# Patient Record
Sex: Female | Born: 1985
Health system: Southern US, Community
[De-identification: ages and names within clinical notes are randomized; demographics above are authoritative.]

## PROBLEM LIST (undated history)

## (undated) DIAGNOSIS — F952 Tourette's disorder: Secondary | ICD-10-CM

## (undated) DIAGNOSIS — E559 Vitamin D deficiency, unspecified: Secondary | ICD-10-CM

## (undated) DIAGNOSIS — E039 Hypothyroidism, unspecified: Secondary | ICD-10-CM

## (undated) DIAGNOSIS — E079 Disorder of thyroid, unspecified: Secondary | ICD-10-CM

## (undated) DIAGNOSIS — K219 Gastro-esophageal reflux disease without esophagitis: Secondary | ICD-10-CM

## (undated) DIAGNOSIS — G43909 Migraine, unspecified, not intractable, without status migrainosus: Secondary | ICD-10-CM

## (undated) DIAGNOSIS — E538 Deficiency of other specified B group vitamins: Secondary | ICD-10-CM

## (undated) DIAGNOSIS — I10 Essential (primary) hypertension: Secondary | ICD-10-CM

## (undated) HISTORY — DX: Deficiency of other specified B group vitamins: E53.8

## (undated) HISTORY — PX: CHOLECYSTECTOMY: SHX55

## (undated) HISTORY — DX: Gastro-esophageal reflux disease without esophagitis: K21.9

## (undated) HISTORY — DX: Vitamin D deficiency, unspecified: E55.9

## (undated) HISTORY — PX: TONSILLECTOMY: SUR1361

## (undated) HISTORY — DX: Tourette's disorder: F95.2

## (undated) HISTORY — PX: ABLATION: SHX5711

## (undated) HISTORY — DX: Migraine, unspecified, not intractable, without status migrainosus: G43.909

## (undated) HISTORY — PX: WISDOM TOOTH EXTRACTION: SHX21

## (undated) HISTORY — DX: Hypothyroidism, unspecified: E03.9

---
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
## 2003-02-05 ENCOUNTER — Other Ambulatory Visit: Admission: RE | Admit: 2003-02-05 | Discharge: 2003-02-05 | Payer: Self-pay | Admitting: Obstetrics and Gynecology

## 2003-06-16 ENCOUNTER — Other Ambulatory Visit: Admission: RE | Admit: 2003-06-16 | Discharge: 2003-06-16 | Payer: Self-pay | Admitting: Gynecology

## 2003-12-31 ENCOUNTER — Other Ambulatory Visit: Admission: RE | Admit: 2003-12-31 | Discharge: 2003-12-31 | Payer: Self-pay | Admitting: Obstetrics and Gynecology

## 2004-03-18 ENCOUNTER — Inpatient Hospital Stay (HOSPITAL_COMMUNITY): Admission: RE | Admit: 2004-03-18 | Discharge: 2004-03-22 | Payer: Self-pay | Admitting: Endocrinology

## 2004-03-18 ENCOUNTER — Encounter: Admission: RE | Admit: 2004-03-18 | Discharge: 2004-03-18 | Payer: Self-pay | Admitting: Family Medicine

## 2004-03-22 ENCOUNTER — Encounter (INDEPENDENT_AMBULATORY_CARE_PROVIDER_SITE_OTHER): Payer: Self-pay | Admitting: *Deleted

## 2004-07-27 ENCOUNTER — Ambulatory Visit (HOSPITAL_COMMUNITY): Admission: RE | Admit: 2004-07-27 | Discharge: 2004-07-27 | Payer: Self-pay | Admitting: Gynecology

## 2004-08-16 ENCOUNTER — Other Ambulatory Visit: Admission: RE | Admit: 2004-08-16 | Discharge: 2004-08-16 | Payer: Self-pay | Admitting: Gynecology

## 2004-12-09 ENCOUNTER — Inpatient Hospital Stay (HOSPITAL_COMMUNITY): Admission: AD | Admit: 2004-12-09 | Discharge: 2004-12-09 | Payer: Self-pay | Admitting: Gynecology

## 2004-12-20 ENCOUNTER — Inpatient Hospital Stay (HOSPITAL_COMMUNITY): Admission: AD | Admit: 2004-12-20 | Discharge: 2004-12-20 | Payer: Self-pay | Admitting: Gynecology

## 2004-12-21 ENCOUNTER — Inpatient Hospital Stay (HOSPITAL_COMMUNITY): Admission: AD | Admit: 2004-12-21 | Discharge: 2004-12-21 | Payer: Self-pay | Admitting: Gynecology

## 2004-12-27 ENCOUNTER — Observation Stay (HOSPITAL_COMMUNITY): Admission: AD | Admit: 2004-12-27 | Discharge: 2004-12-28 | Payer: Self-pay | Admitting: Gynecology

## 2005-06-13 ENCOUNTER — Ambulatory Visit: Payer: Self-pay | Admitting: Family Medicine

## 2005-06-28 ENCOUNTER — Ambulatory Visit: Payer: Self-pay | Admitting: Family Medicine

## 2005-08-30 ENCOUNTER — Ambulatory Visit: Payer: Self-pay | Admitting: Family Medicine

## 2005-12-26 ENCOUNTER — Ambulatory Visit: Payer: Self-pay | Admitting: Family Medicine

## 2006-01-26 ENCOUNTER — Encounter: Payer: Self-pay | Admitting: Family Medicine

## 2006-01-26 LAB — CONVERTED CEMR LAB

## 2006-01-31 ENCOUNTER — Other Ambulatory Visit: Admission: RE | Admit: 2006-01-31 | Discharge: 2006-01-31 | Payer: Self-pay | Admitting: Obstetrics and Gynecology

## 2006-08-30 ENCOUNTER — Ambulatory Visit: Payer: Self-pay | Admitting: Family Medicine

## 2006-11-13 ENCOUNTER — Ambulatory Visit: Payer: Self-pay | Admitting: Internal Medicine

## 2007-02-14 ENCOUNTER — Telehealth: Payer: Self-pay | Admitting: Family Medicine

## 2007-05-30 ENCOUNTER — Emergency Department (HOSPITAL_COMMUNITY): Admission: EM | Admit: 2007-05-30 | Discharge: 2007-05-30 | Payer: Self-pay | Admitting: Emergency Medicine

## 2007-08-05 ENCOUNTER — Emergency Department (HOSPITAL_COMMUNITY): Admission: EM | Admit: 2007-08-05 | Discharge: 2007-08-05 | Payer: Self-pay | Admitting: Emergency Medicine

## 2007-08-06 ENCOUNTER — Encounter: Payer: Self-pay | Admitting: Family Medicine

## 2007-08-06 ENCOUNTER — Ambulatory Visit: Payer: Self-pay | Admitting: Family Medicine

## 2007-08-06 DIAGNOSIS — F988 Other specified behavioral and emotional disorders with onset usually occurring in childhood and adolescence: Secondary | ICD-10-CM | POA: Insufficient documentation

## 2007-08-06 DIAGNOSIS — E039 Hypothyroidism, unspecified: Secondary | ICD-10-CM | POA: Insufficient documentation

## 2007-08-06 DIAGNOSIS — Z8719 Personal history of other diseases of the digestive system: Secondary | ICD-10-CM

## 2007-08-06 DIAGNOSIS — F411 Generalized anxiety disorder: Secondary | ICD-10-CM | POA: Insufficient documentation

## 2007-08-06 DIAGNOSIS — L509 Urticaria, unspecified: Secondary | ICD-10-CM | POA: Insufficient documentation

## 2007-08-06 DIAGNOSIS — F952 Tourette's disorder: Secondary | ICD-10-CM

## 2007-08-06 DIAGNOSIS — I10 Essential (primary) hypertension: Secondary | ICD-10-CM | POA: Insufficient documentation

## 2007-08-07 ENCOUNTER — Encounter: Payer: Self-pay | Admitting: Family Medicine

## 2007-08-08 ENCOUNTER — Encounter (INDEPENDENT_AMBULATORY_CARE_PROVIDER_SITE_OTHER): Payer: Self-pay | Admitting: *Deleted

## 2007-08-08 ENCOUNTER — Telehealth (INDEPENDENT_AMBULATORY_CARE_PROVIDER_SITE_OTHER): Payer: Self-pay | Admitting: *Deleted

## 2007-08-08 LAB — CONVERTED CEMR LAB
Basophils Absolute: 0 10*3/uL (ref 0.0–0.1)
Eosinophils Absolute: 0 10*3/uL (ref 0.0–0.6)
Eosinophils Relative: 0.2 % (ref 0.0–5.0)
Lymphocytes Relative: 25.2 % (ref 12.0–46.0)
MCHC: 34.1 g/dL (ref 30.0–36.0)
MCV: 89.6 fL (ref 78.0–100.0)
Monocytes Absolute: 0.6 10*3/uL (ref 0.2–0.7)
Monocytes Relative: 4.8 % (ref 3.0–11.0)
Neutro Abs: 8.2 10*3/uL — ABNORMAL HIGH (ref 1.4–7.7)
Platelets: 210 10*3/uL (ref 150–400)
RBC: 4.26 M/uL (ref 3.87–5.11)
RDW: 12.5 % (ref 11.5–14.6)

## 2007-08-26 ENCOUNTER — Ambulatory Visit: Payer: Self-pay | Admitting: Family Medicine

## 2007-09-23 ENCOUNTER — Telehealth (INDEPENDENT_AMBULATORY_CARE_PROVIDER_SITE_OTHER): Payer: Self-pay | Admitting: *Deleted

## 2007-09-25 ENCOUNTER — Telehealth: Payer: Self-pay | Admitting: Family Medicine

## 2007-09-27 ENCOUNTER — Ambulatory Visit: Payer: Self-pay | Admitting: Family Medicine

## 2007-11-22 ENCOUNTER — Ambulatory Visit: Payer: Self-pay | Admitting: Family Medicine

## 2007-11-25 ENCOUNTER — Encounter (INDEPENDENT_AMBULATORY_CARE_PROVIDER_SITE_OTHER): Payer: Self-pay | Admitting: *Deleted

## 2007-11-25 LAB — CONVERTED CEMR LAB
Albumin: 3.9 g/dL (ref 3.5–5.2)
Chloride: 107 meq/L (ref 96–112)
Cortisol, Plasma: 10 ug/dL
Free T4: 0.8 ng/dL (ref 0.6–1.6)
GFR calc Af Amer: 133 mL/min
Potassium: 4.1 meq/L (ref 3.5–5.1)
TSH: 1.05 microintl units/mL (ref 0.35–5.50)

## 2007-11-27 LAB — CONVERTED CEMR LAB
HCV Ab: NEGATIVE
Hep B C IgM: NEGATIVE
Hepatitis B Surface Ag: NEGATIVE

## 2008-02-12 ENCOUNTER — Ambulatory Visit: Payer: Self-pay | Admitting: Family Medicine

## 2008-02-12 ENCOUNTER — Telehealth: Payer: Self-pay | Admitting: Family Medicine

## 2008-02-13 LAB — CONVERTED CEMR LAB: Potassium: 4 meq/L (ref 3.5–5.1)

## 2008-04-13 ENCOUNTER — Encounter: Payer: Self-pay | Admitting: Family Medicine

## 2008-04-20 ENCOUNTER — Encounter (INDEPENDENT_AMBULATORY_CARE_PROVIDER_SITE_OTHER): Payer: Self-pay | Admitting: Otolaryngology

## 2008-04-20 ENCOUNTER — Ambulatory Visit (HOSPITAL_BASED_OUTPATIENT_CLINIC_OR_DEPARTMENT_OTHER): Admission: RE | Admit: 2008-04-20 | Discharge: 2008-04-20 | Payer: Self-pay | Admitting: Otolaryngology

## 2008-04-27 ENCOUNTER — Telehealth (INDEPENDENT_AMBULATORY_CARE_PROVIDER_SITE_OTHER): Payer: Self-pay | Admitting: *Deleted

## 2008-05-08 ENCOUNTER — Encounter: Payer: Self-pay | Admitting: Family Medicine

## 2008-06-16 ENCOUNTER — Telehealth: Payer: Self-pay | Admitting: Family Medicine

## 2008-06-16 ENCOUNTER — Ambulatory Visit: Payer: Self-pay | Admitting: Family Medicine

## 2008-06-17 ENCOUNTER — Telehealth: Payer: Self-pay | Admitting: Family Medicine

## 2008-06-18 ENCOUNTER — Telehealth: Payer: Self-pay | Admitting: Family Medicine

## 2008-06-19 ENCOUNTER — Telehealth: Payer: Self-pay | Admitting: Family Medicine

## 2008-06-19 ENCOUNTER — Ambulatory Visit: Payer: Self-pay | Admitting: Family Medicine

## 2008-06-20 ENCOUNTER — Telehealth: Payer: Self-pay | Admitting: Family Medicine

## 2008-07-03 ENCOUNTER — Ambulatory Visit: Payer: Self-pay | Admitting: Family Medicine

## 2008-07-08 ENCOUNTER — Telehealth (INDEPENDENT_AMBULATORY_CARE_PROVIDER_SITE_OTHER): Payer: Self-pay | Admitting: *Deleted

## 2008-08-20 ENCOUNTER — Ambulatory Visit: Payer: Self-pay | Admitting: Family Medicine

## 2008-08-24 LAB — CONVERTED CEMR LAB
Calcium: 9.2 mg/dL (ref 8.4–10.5)
Eosinophils Absolute: 0.1 10*3/uL (ref 0.0–0.7)
Eosinophils Relative: 1.7 % (ref 0.0–5.0)
Free T4: 0.8 ng/dL (ref 0.6–1.6)
GFR calc non Af Amer: 94 mL/min
HCT: 38.8 % (ref 36.0–46.0)
Hemoglobin: 13.3 g/dL (ref 12.0–15.0)
Lymphocytes Relative: 31.7 % (ref 12.0–46.0)
MCHC: 34.2 g/dL (ref 30.0–36.0)
Platelets: 252 10*3/uL (ref 150–400)
RBC: 4.47 M/uL (ref 3.87–5.11)
T4, Total: 6.7 ug/dL (ref 5.0–12.5)
Vitamin B-12: 435 pg/mL (ref 211–911)

## 2008-08-25 LAB — CONVERTED CEMR LAB: Vit D, 1,25-Dihydroxy: 20 — ABNORMAL LOW (ref 30–89)

## 2008-09-23 ENCOUNTER — Ambulatory Visit: Payer: Self-pay | Admitting: Family Medicine

## 2008-09-23 DIAGNOSIS — I8390 Asymptomatic varicose veins of unspecified lower extremity: Secondary | ICD-10-CM | POA: Insufficient documentation

## 2008-09-24 LAB — CONVERTED CEMR LAB
HDL: 36.9 mg/dL — ABNORMAL LOW (ref >39.0)
Total CHOL/HDL Ratio: 4.3

## 2008-10-22 ENCOUNTER — Ambulatory Visit: Payer: Self-pay | Admitting: Family Medicine

## 2008-10-22 DIAGNOSIS — E559 Vitamin D deficiency, unspecified: Secondary | ICD-10-CM | POA: Insufficient documentation

## 2008-10-26 LAB — CONVERTED CEMR LAB: Vit D, 25-Hydroxy: 50 ng/mL (ref 30–89)

## 2008-11-02 ENCOUNTER — Telehealth: Payer: Self-pay | Admitting: Family Medicine

## 2008-11-12 ENCOUNTER — Telehealth: Payer: Self-pay | Admitting: Family Medicine

## 2008-11-19 ENCOUNTER — Ambulatory Visit: Payer: Self-pay | Admitting: Family Medicine

## 2009-01-11 ENCOUNTER — Telehealth: Payer: Self-pay | Admitting: Family Medicine

## 2009-09-06 ENCOUNTER — Inpatient Hospital Stay (HOSPITAL_COMMUNITY): Admission: AD | Admit: 2009-09-06 | Discharge: 2009-09-06 | Payer: Self-pay | Admitting: Obstetrics and Gynecology

## 2009-09-13 ENCOUNTER — Inpatient Hospital Stay (HOSPITAL_COMMUNITY): Admission: AD | Admit: 2009-09-13 | Discharge: 2009-09-21 | Payer: Self-pay | Admitting: Obstetrics and Gynecology

## 2009-09-16 ENCOUNTER — Encounter (INDEPENDENT_AMBULATORY_CARE_PROVIDER_SITE_OTHER): Payer: Self-pay | Admitting: Obstetrics and Gynecology

## 2009-11-03 ENCOUNTER — Emergency Department (HOSPITAL_COMMUNITY): Admission: EM | Admit: 2009-11-03 | Discharge: 2009-11-04 | Payer: Self-pay | Admitting: Emergency Medicine

## 2009-12-21 ENCOUNTER — Encounter: Admission: RE | Admit: 2009-12-21 | Discharge: 2009-12-21 | Payer: Self-pay | Admitting: Family Medicine

## 2010-02-11 ENCOUNTER — Encounter: Admission: RE | Admit: 2010-02-11 | Discharge: 2010-02-11 | Payer: Self-pay | Admitting: *Deleted

## 2010-11-13 LAB — LACTATE DEHYDROGENASE
LDH: 134 U/L (ref 94–250)
LDH: 157 U/L (ref 94–250)
LDH: 166 U/L (ref 94–250)
LDH: 242 U/L (ref 94–250)
LDH: 280 U/L — ABNORMAL HIGH (ref 94–250)
LDH: 378 U/L — ABNORMAL HIGH (ref 94–250)
LDH: 391 U/L — ABNORMAL HIGH (ref 94–250)
LDH: 530 U/L — ABNORMAL HIGH (ref 94–250)

## 2010-11-13 LAB — COMPREHENSIVE METABOLIC PANEL
ALT: 123 U/L — ABNORMAL HIGH (ref 0–35)
ALT: 143 U/L — ABNORMAL HIGH (ref 0–35)
ALT: 179 U/L — ABNORMAL HIGH (ref 0–35)
ALT: 237 U/L — ABNORMAL HIGH (ref 0–35)
ALT: 238 U/L — ABNORMAL HIGH (ref 0–35)
ALT: 283 U/L — ABNORMAL HIGH (ref 0–35)
ALT: 40 U/L — ABNORMAL HIGH (ref 0–35)
ALT: 53 U/L — ABNORMAL HIGH (ref 0–35)
ALT: 57 U/L — ABNORMAL HIGH (ref 0–35)
AST: 122 U/L — ABNORMAL HIGH (ref 0–37)
AST: 218 U/L — ABNORMAL HIGH (ref 0–37)
AST: 226 U/L — ABNORMAL HIGH (ref 0–37)
AST: 30 U/L (ref 0–37)
AST: 53 U/L — ABNORMAL HIGH (ref 0–37)
AST: 57 U/L — ABNORMAL HIGH (ref 0–37)
AST: 60 U/L — ABNORMAL HIGH (ref 0–37)
Albumin: 2.3 g/dL — ABNORMAL LOW (ref 3.5–5.2)
Albumin: 2.5 g/dL — ABNORMAL LOW (ref 3.5–5.2)
Albumin: 2.5 g/dL — ABNORMAL LOW (ref 3.5–5.2)
Albumin: 2.6 g/dL — ABNORMAL LOW (ref 3.5–5.2)
Alkaline Phosphatase: 100 U/L (ref 39–117)
Alkaline Phosphatase: 106 U/L (ref 39–117)
Alkaline Phosphatase: 111 U/L (ref 39–117)
Alkaline Phosphatase: 116 U/L (ref 39–117)
Alkaline Phosphatase: 123 U/L — ABNORMAL HIGH (ref 39–117)
Alkaline Phosphatase: 81 U/L (ref 39–117)
Alkaline Phosphatase: 87 U/L (ref 39–117)
Alkaline Phosphatase: 94 U/L (ref 39–117)
Alkaline Phosphatase: 98 U/L (ref 39–117)
BUN: 10 mg/dL (ref 6–23)
BUN: 10 mg/dL (ref 6–23)
BUN: 11 mg/dL (ref 6–23)
BUN: 13 mg/dL (ref 6–23)
BUN: 8 mg/dL (ref 6–23)
CO2: 20 mEq/L (ref 19–32)
CO2: 21 mEq/L (ref 19–32)
CO2: 22 mEq/L (ref 19–32)
CO2: 23 mEq/L (ref 19–32)
CO2: 23 mEq/L (ref 19–32)
CO2: 23 mEq/L (ref 19–32)
CO2: 25 mEq/L (ref 19–32)
CO2: 25 mEq/L (ref 19–32)
CO2: 26 mEq/L (ref 19–32)
CO2: 27 mEq/L (ref 19–32)
CO2: 30 mEq/L (ref 19–32)
Calcium: 6.4 mg/dL — CL (ref 8.4–10.5)
Calcium: 6.9 mg/dL — ABNORMAL LOW (ref 8.4–10.5)
Calcium: 7.2 mg/dL — ABNORMAL LOW (ref 8.4–10.5)
Calcium: 7.4 mg/dL — ABNORMAL LOW (ref 8.4–10.5)
Calcium: 7.9 mg/dL — ABNORMAL LOW (ref 8.4–10.5)
Calcium: 7.9 mg/dL — ABNORMAL LOW (ref 8.4–10.5)
Calcium: 8.4 mg/dL (ref 8.4–10.5)
Calcium: 8.7 mg/dL (ref 8.4–10.5)
Calcium: 8.7 mg/dL (ref 8.4–10.5)
Chloride: 104 mEq/L (ref 96–112)
Chloride: 102 mEq/L (ref 96–112)
Chloride: 103 mEq/L (ref 96–112)
Chloride: 104 mEq/L (ref 96–112)
Chloride: 104 mEq/L (ref 96–112)
Chloride: 107 mEq/L (ref 96–112)
Chloride: 99 mEq/L (ref 96–112)
Creatinine, Ser: 0.47 mg/dL (ref 0.4–1.2)
Creatinine, Ser: 0.6 mg/dL (ref 0.4–1.2)
Creatinine, Ser: 0.66 mg/dL (ref 0.4–1.2)
Creatinine, Ser: 0.68 mg/dL (ref 0.4–1.2)
GFR calc Af Amer: >60 mL/min (ref >60)
GFR calc Af Amer: >60 mL/min (ref >60)
GFR calc Af Amer: >60 mL/min (ref >60)
GFR calc Af Amer: >60 mL/min (ref >60)
GFR calc Af Amer: >60 mL/min (ref >60)
GFR calc Af Amer: >60 mL/min (ref >60)
GFR calc non Af Amer: >60 mL/min (ref >60)
GFR calc non Af Amer: >60 mL/min (ref >60)
GFR calc non Af Amer: >60 mL/min (ref >60)
GFR calc non Af Amer: >60 mL/min (ref >60)
GFR calc non Af Amer: >60 mL/min (ref >60)
GFR calc non Af Amer: >60 mL/min (ref >60)
GFR calc non Af Amer: >60 mL/min (ref >60)
GFR calc non Af Amer: >60 mL/min (ref >60)
GFR calc non Af Amer: >60 mL/min (ref >60)
GFR calc non Af Amer: >60 mL/min (ref >60)
Glucose, Bld: 101 mg/dL — ABNORMAL HIGH (ref 70–99)
Glucose, Bld: 133 mg/dL — ABNORMAL HIGH (ref 70–99)
Glucose, Bld: 73 mg/dL (ref 70–99)
Glucose, Bld: 73 mg/dL (ref 70–99)
Glucose, Bld: 75 mg/dL (ref 70–99)
Glucose, Bld: 80 mg/dL (ref 70–99)
Glucose, Bld: 80 mg/dL (ref 70–99)
Glucose, Bld: 81 mg/dL (ref 70–99)
Glucose, Bld: 81 mg/dL (ref 70–99)
Glucose, Bld: 95 mg/dL (ref 70–99)
Potassium: 4.1 mEq/L (ref 3.5–5.1)
Potassium: 4.2 mEq/L (ref 3.5–5.1)
Potassium: 4.2 mEq/L (ref 3.5–5.1)
Potassium: 4.3 mEq/L (ref 3.5–5.1)
Potassium: 4.3 mEq/L (ref 3.5–5.1)
Potassium: 4.4 mEq/L (ref 3.5–5.1)
Potassium: 4.5 mEq/L (ref 3.5–5.1)
Potassium: 4.6 mEq/L (ref 3.5–5.1)
Potassium: 4.7 mEq/L (ref 3.5–5.1)
Sodium: 134 mEq/L — ABNORMAL LOW (ref 135–145)
Sodium: 132 mEq/L — ABNORMAL LOW (ref 135–145)
Sodium: 134 mEq/L — ABNORMAL LOW (ref 135–145)
Sodium: 135 mEq/L (ref 135–145)
Sodium: 135 mEq/L (ref 135–145)
Sodium: 135 mEq/L (ref 135–145)
Sodium: 135 mEq/L (ref 135–145)
Sodium: 135 mEq/L (ref 135–145)
Sodium: 136 mEq/L (ref 135–145)
Sodium: 136 mEq/L (ref 135–145)
Sodium: 137 mEq/L (ref 135–145)
Total Bilirubin: 0.2 mg/dL — ABNORMAL LOW (ref 0.3–1.2)
Total Bilirubin: 0.1 mg/dL — ABNORMAL LOW (ref 0.3–1.2)
Total Bilirubin: 0.3 mg/dL (ref 0.3–1.2)
Total Bilirubin: 0.3 mg/dL (ref 0.3–1.2)
Total Bilirubin: 0.5 mg/dL (ref 0.3–1.2)
Total Bilirubin: 0.6 mg/dL (ref 0.3–1.2)
Total Bilirubin: 0.7 mg/dL (ref 0.3–1.2)
Total Bilirubin: 0.9 mg/dL (ref 0.3–1.2)
Total Protein: 4.8 g/dL — ABNORMAL LOW (ref 6.0–8.3)
Total Protein: 5.4 g/dL — ABNORMAL LOW (ref 6.0–8.3)
Total Protein: 5.5 g/dL — ABNORMAL LOW (ref 6.0–8.3)
Total Protein: 5.8 g/dL — ABNORMAL LOW (ref 6.0–8.3)
Total Protein: 5.8 g/dL — ABNORMAL LOW (ref 6.0–8.3)

## 2010-11-13 LAB — CBC
HCT: 35.3 % — ABNORMAL LOW (ref 36.0–46.0)
HCT: 28.6 % — ABNORMAL LOW (ref 36.0–46.0)
HCT: 31.5 % — ABNORMAL LOW (ref 36.0–46.0)
HCT: 32.5 % — ABNORMAL LOW (ref 36.0–46.0)
HCT: 32.8 % — ABNORMAL LOW (ref 36.0–46.0)
HCT: 34.1 % — ABNORMAL LOW (ref 36.0–46.0)
HCT: 34.3 % — ABNORMAL LOW (ref 36.0–46.0)
HCT: 34.9 % — ABNORMAL LOW (ref 36.0–46.0)
Hemoglobin: 12.2 g/dL (ref 12.0–15.0)
Hemoglobin: 10.8 g/dL — ABNORMAL LOW (ref 12.0–15.0)
Hemoglobin: 10.8 g/dL — ABNORMAL LOW (ref 12.0–15.0)
Hemoglobin: 10.9 g/dL — ABNORMAL LOW (ref 12.0–15.0)
Hemoglobin: 10.9 g/dL — ABNORMAL LOW (ref 12.0–15.0)
Hemoglobin: 11 g/dL — ABNORMAL LOW (ref 12.0–15.0)
Hemoglobin: 11.6 g/dL — ABNORMAL LOW (ref 12.0–15.0)
Hemoglobin: 12.7 g/dL (ref 12.0–15.0)
MCHC: 34.5 g/dL (ref 30.0–36.0)
MCHC: 33.7 g/dL (ref 30.0–36.0)
MCHC: 34 g/dL (ref 30.0–36.0)
MCHC: 34 g/dL (ref 30.0–36.0)
MCHC: 34 g/dL (ref 30.0–36.0)
MCHC: 34.1 g/dL (ref 30.0–36.0)
MCHC: 34.2 g/dL (ref 30.0–36.0)
MCHC: 34.2 g/dL (ref 30.0–36.0)
MCHC: 34.3 g/dL (ref 30.0–36.0)
MCHC: 34.3 g/dL (ref 30.0–36.0)
MCHC: 34.5 g/dL (ref 30.0–36.0)
MCHC: 34.6 g/dL (ref 30.0–36.0)
MCV: 93.7 fL (ref 78.0–100.0)
MCV: 93.9 fL (ref 78.0–100.0)
MCV: 94 fL (ref 78.0–100.0)
MCV: 94.1 fL (ref 78.0–100.0)
MCV: 94.1 fL (ref 78.0–100.0)
MCV: 94.2 fL (ref 78.0–100.0)
MCV: 94.5 fL (ref 78.0–100.0)
MCV: 94.6 fL (ref 78.0–100.0)
MCV: 94.8 fL (ref 78.0–100.0)
MCV: 94.8 fL (ref 78.0–100.0)
MCV: 94.9 fL (ref 78.0–100.0)
Platelets: 136 10*3/uL — ABNORMAL LOW (ref 150–400)
Platelets: 106 10*3/uL — ABNORMAL LOW (ref 150–400)
Platelets: 125 10*3/uL — ABNORMAL LOW (ref 150–400)
Platelets: 45 10*3/uL — ABNORMAL LOW (ref 150–400)
Platelets: 48 10*3/uL — ABNORMAL LOW (ref 150–400)
Platelets: 53 10*3/uL — ABNORMAL LOW (ref 150–400)
Platelets: 95 10*3/uL — ABNORMAL LOW (ref 150–400)
RBC: 3.24 MIL/uL — ABNORMAL LOW (ref 3.87–5.11)
RBC: 3.31 MIL/uL — ABNORMAL LOW (ref 3.87–5.11)
RBC: 3.34 MIL/uL — ABNORMAL LOW (ref 3.87–5.11)
RBC: 3.35 MIL/uL — ABNORMAL LOW (ref 3.87–5.11)
RBC: 3.38 MIL/uL — ABNORMAL LOW (ref 3.87–5.11)
RBC: 3.39 MIL/uL — ABNORMAL LOW (ref 3.87–5.11)
RBC: 3.46 MIL/uL — ABNORMAL LOW (ref 3.87–5.11)
RBC: 3.62 MIL/uL — ABNORMAL LOW (ref 3.87–5.11)
RBC: 3.63 MIL/uL — ABNORMAL LOW (ref 3.87–5.11)
RBC: 3.66 MIL/uL — ABNORMAL LOW (ref 3.87–5.11)
RBC: 3.73 MIL/uL — ABNORMAL LOW (ref 3.87–5.11)
RBC: 3.91 MIL/uL (ref 3.87–5.11)
RBC: 3.97 MIL/uL (ref 3.87–5.11)
RDW: 12.5 % (ref 11.5–15.5)
RDW: 12.7 % (ref 11.5–15.5)
RDW: 13.4 % (ref 11.5–15.5)
RDW: 13.6 % (ref 11.5–15.5)
RDW: 13.8 % (ref 11.5–15.5)
WBC: 10.6 10*3/uL — ABNORMAL HIGH (ref 4.0–10.5)
WBC: 12.3 10*3/uL — ABNORMAL HIGH (ref 4.0–10.5)
WBC: 13.4 10*3/uL — ABNORMAL HIGH (ref 4.0–10.5)
WBC: 13.8 10*3/uL — ABNORMAL HIGH (ref 4.0–10.5)
WBC: 14.8 10*3/uL — ABNORMAL HIGH (ref 4.0–10.5)
WBC: 15 10*3/uL — ABNORMAL HIGH (ref 4.0–10.5)
WBC: 16.3 10*3/uL — ABNORMAL HIGH (ref 4.0–10.5)
WBC: 16.4 10*3/uL — ABNORMAL HIGH (ref 4.0–10.5)
WBC: 18 10*3/uL — ABNORMAL HIGH (ref 4.0–10.5)
WBC: 18.5 10*3/uL — ABNORMAL HIGH (ref 4.0–10.5)

## 2010-11-13 LAB — URINALYSIS, ROUTINE W REFLEX MICROSCOPIC
Bilirubin Urine: NEGATIVE
Glucose, UA: NEGATIVE mg/dL
Glucose, UA: NEGATIVE mg/dL
Ketones, ur: NEGATIVE mg/dL
Ketones, ur: NEGATIVE mg/dL
Leukocytes, UA: NEGATIVE
Leukocytes, UA: NEGATIVE
Protein, ur: 30 mg/dL — AB
Protein, ur: 100 mg/dL — AB
Specific Gravity, Urine: 1.02 (ref 1.005–1.030)
Urobilinogen, UA: 0.2 mg/dL (ref 0.0–1.0)
Urobilinogen, UA: 1 mg/dL (ref 0.0–1.0)
pH: 5.5 (ref 5.0–8.0)
pH: 8 (ref 5.0–8.0)

## 2010-11-13 LAB — CROSSMATCH

## 2010-11-13 LAB — PROTIME-INR
INR: 0.96 (ref 0.00–1.49)
Prothrombin Time: 12.7 seconds (ref 11.6–15.2)

## 2010-11-13 LAB — URINE CULTURE

## 2010-11-13 LAB — URIC ACID
Uric Acid, Serum: 4.6 mg/dL (ref 2.4–7.0)
Uric Acid, Serum: 4.8 mg/dL (ref 2.4–7.0)
Uric Acid, Serum: 5.2 mg/dL (ref 2.4–7.0)
Uric Acid, Serum: 5.3 mg/dL (ref 2.4–7.0)

## 2010-11-13 LAB — STREP B DNA PROBE: Strep Group B Ag: NEGATIVE

## 2010-11-13 LAB — URINE MICROSCOPIC-ADD ON

## 2010-11-13 LAB — CREATININE CLEARANCE, URINE, 24 HOUR
Collection Interval-CRCL: 24 hours
Creatinine Clearance: 158 mL/min — ABNORMAL HIGH (ref 75–115)

## 2010-11-13 LAB — PROTEIN, URINE, 24 HOUR
Collection Interval-UPROT: 24 hours
Protein, 24H Urine: 675 mg/d — ABNORMAL HIGH (ref 50–100)
Protein, Urine: 54 mg/dL

## 2010-11-13 LAB — MAGNESIUM: Magnesium: 5.2 mg/dL — ABNORMAL HIGH (ref 1.5–2.5)

## 2010-11-13 LAB — GAMMA GT: GGT: 32 U/L (ref 7–51)

## 2010-11-18 LAB — DIFFERENTIAL
Basophils Absolute: 0 10*3/uL (ref 0.0–0.1)
Basophils Relative: 0 % (ref 0–1)
Neutro Abs: 5.4 10*3/uL (ref 1.7–7.7)
Neutrophils Relative %: 61 % (ref 43–77)

## 2010-11-18 LAB — POCT I-STAT, CHEM 8
Calcium, Ion: 1.05 mmol/L — ABNORMAL LOW (ref 1.12–1.32)
Chloride: 110 mEq/L (ref 96–112)
HCT: 36 % (ref 36.0–46.0)
Potassium: 3.4 mEq/L — ABNORMAL LOW (ref 3.5–5.1)
Sodium: 136 mEq/L (ref 135–145)

## 2010-11-18 LAB — D-DIMER, QUANTITATIVE: D-Dimer, Quant: 0.76 ug/mL-FEU — ABNORMAL HIGH (ref 0.00–0.48)

## 2010-11-18 LAB — CBC
MCHC: 34.9 g/dL (ref 30.0–36.0)
Platelets: 206 10*3/uL (ref 150–400)
RDW: 12.4 % (ref 11.5–15.5)

## 2011-01-10 NOTE — Op Note (Signed)
NAMEMYEESHA, SHANE             ACCOUNT NO.:  [id number]   MEDICAL RECORD NO.:  [id number]          PATIENT TYPE:  AMB   LOCATION:  DSC                          FACILITY:  [facility]   PHYSICIAN:  Jefry H. Pollyann Kennedy, MD     DATE OF BIRTH:  August 12, 1986   DATE OF PROCEDURE:  04/20/2008  DATE OF DISCHARGE:                               OPERATIVE REPORT   PREOPERATIVE DIAGNOSIS:  Chronic tonsillitis.   POSTOPERATIVE DIAGNOSIS:  Chronic tonsillitis.   PROCEDURE:  Tonsillectomy.   SURGEON:  Jefry H. Pollyann Kennedy, MD   General endotracheal anesthesia was used.   No complications.   ESTIMATED BLOOD LOSS:  Minimal.   FINDINGS:  Enlarged cryptic tonsils with deep cryptic space with large  amount of cryptic debris, worse on the right side.   HISTORY:  A 25 year old with a history of chronic and recurring  tonsillopharyngitis.  Risks, benefits, alternatives, and complications  of the procedure were explained to the patient, and she seems to  understand and agreed to surgery.   PROCEDURE:  The patient was taken to the operating room, placed on the  operating room table in supine position.  Following induction of general  endotracheal anesthesia, the table was turned.  The patient was draped  in the standard fashion.  A Crowe-Davis mouthgag was inserted into the  oral cavity, used to retract the tongue and mandible, and attached to  Mayo stand.  Inspection of the palate revealed no evidence of a  submucous cleft or shortening of the soft palate.  Red rubber catheter  was inserted into the right side of the nose, withdrawn through the  mouth, and used to retract the soft palate and uvula.  The nasopharynx  was palpated.  There is no appreciable adenoid tissue present.  The  tonsillectomy was performed using electrocautery dissection, carefully  dissecting the avascular plane between the capsule and constrictor  muscles.  A large amount of cryptic debris exuded from the tonsils  during the  manipulation.  Tonsils were sent together for pathological  evaluation.  A suction cautery device  was used for completion of hemostasis.  The pharynx was irrigated with  saline and suctioned.  An orogastric tube was used to aspirate the  contents of the stomach.  The patient was then awakened, extubated, and  transferred to recovery in stable condition.      Jefry H. Pollyann Kennedy, MD  Electronically Signed     JHR/MEDQ  D:  04/20/2008  T:  04/20/2008  Job:  [id number]

## 2011-01-13 NOTE — Consult Note (Signed)
NAME:  Rebecca [name], Rebecca [name]                         ACCOUNT NO.:  [id number]   MEDICAL RECORD NO.:  [id number]                   PATIENT TYPE:  INP   LOCATION:  5504                                 FACILITY:  [facility]   PHYSICIAN:  Iva Boop, M.D. [facility]           DATE OF BIRTH:  07/13/1986   DATE OF CONSULTATION:  DATE OF DISCHARGE:                                   CONSULTATION   REFERRING PHYSICIAN:  Dr. Corinda Gubler.   PRIMARY CARE PHYSICIAN:  Dr. Roxy Manns.   REASON FOR CONSULTATION:  Colitis.   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  She usually moves her bowels  about every two weeks without much discomfort or pain.  She had some  congestion and took pseudoephedrine for about one week and developed high  blood pressure and did not feel quite right and thought that it aggravated  her Tourette's syndrome.  Subsequent to that, she developed crampy abdominal  pain, nausea, vomiting, fevers to 102.9 with watery diarrhea and bloody  bowel movements with that.  This was worse on the day of admission, March 18, 2004.  In the office, she had a hemoglobin of 15.3, hematocrit 45, white  count 12.3, platelet count 198.  Her glucose was 49, her potassium was 2.7.  A CT scan of the abdomen, which I have self-viewed, as well, on the  computer, showed diffuse colitis with an edematous colon, mainly in the  right colon.  She has been improving here in the hospital.  She has less  pain, but still has diarrhea.  She has had a set stool Hemoccults that are  positive, most of which were negative, however.  Stool for Clostridium  difficile was negative.   MEDICATIONS:  At home:  1. Synthroid 75 mcg q. day.  2. Topamax 25 mg b.i.d.  3. Aviane 28 q. day.  4. Clonidine 0.1 mg b.i.d.  5. ORAP 1 mg b.i.d.   ALLERGIES:  SULFA, causes hives.   PAST MEDICAL HISTORY:  1. Tourette's syndrome.  2. Attention deficit disorder.  3.  Hypothyroidism.   SOCIAL HISTORY:  No alcohol or tobacco.  She is in nursing school at [employer]  planning to transfer to [employer]   FAMILY HISTORY:  Grandfather died at age 79 of colon cancer.  Other  grandfather had myocardial infarction.  Father has Barrett's esophagus and  has had coronary artery bypass grafting as well.   REVIEW OF SYSTEMS:  No dysuria, no respiratory symptoms at this point.  Her  congestion is better.  Weight dropped 40 pounds with initiation of Synthroid  over one year ago.  All other review of systems appears negative at this  time.  She has been improving, as mentioned above.  She does have some  insomnia, other than those things mentioned above.   PHYSICAL EXAMINATION:  GENERAL:  Pleasant, young, white woman in no acute  distress.  VITAL SIGNS:  Temperature 97.7; pulse 76; blood pressure 111/69;  respirations 18.  EYES:  Anicteric.  MOUTH:  Free of lesions.  NECK:  Supple.  No mass or thyromegaly.  CHEST:  Clear.  HEART:  S1, S2, no murmurs, rubs or gallops.  ABDOMEN:  Soft, nontender at this time, bowel sounds are present.  There is  no organomegaly or mass.  EXTREMITIES:  No edema.  SKIN:  Tanned; no acute rash; warm and dry.  NEUROLOGIC:  She is alert and oriented times three.  Cranial nerves II-XII  are grossly intact.  LYMPH NODES:  No neck or supraclavicular nodes.   LABORATORY DATA:  From most recent labs, BMET looks normal today.  Hemoglobin 12.9, white count 5.8, hematocrit 37, platelets 233.  Clostridium  difficile was negative.   X-rays as described above.   ASSESSMENT:  1. Acute colitis after using pseudoephedrine for one week, which caused     hypertension.  I think she probably has ischemic colitis related to that.     Infection is not excluded.  2. Chronic constipation, which sounds like slow transient constipation.  3. Tourette's syndrome.  4. Hypothyroidism.   RECOMMENDATIONS AND PLAN:  I think that we should proceed with colonoscopy   to confirm this diagnosis.  I am fairly confident that she has ischemic  colitis.  She was given some empiric antibiotics.  I do not think that it is  infectious.  Fortunately, she is improving.   Once colonoscopy is complete and she is improved, she will need a long term  laxative regimen either with an osmotic laxative or something like Zelnorm.  Given that she does not have much in the way of pain, the osmotic laxative  may be the way to go.  She will need outpatient follow-up for this.   I have explained the risks, benefits and indications of colonoscopy and she  understands these include, but not limited to, bleeding, infection,  medication reaction, perforations, or possible need for surgery and she  agrees to proceed.   I appreciate the opportunity to care for this patient.                                               Iva Boop, M.D. [facility]    CEG/MEDQ  D:  03/21/2004  T:  03/21/2004  Job:  [id number]   cc:   Marne A. Milinda Antis, M.D. [facility]

## 2011-01-13 NOTE — Discharge Summary (Signed)
NAMECRISTA, Rebecca [name]               ACCOUNT NO.:  [id number]   MEDICAL RECORD NO.:  [id number]          PATIENT TYPE:  INP   LOCATION:  5526                         FACILITY:  [facility]   PHYSICIAN:  Rene Paci, M.D. LHCDATE OF BIRTH:  [birth year]   DATE OF ADMISSION:  03/18/2004  DATE OF DISCHARGE:  03/22/2004                                 DISCHARGE SUMMARY   DISCHARGE DIAGNOSIS:  Probable ischemic colitis.   BRIEF HISTORY:  Ms. Rebecca [name] is a 25 year old white female who was in her usual  state of health until March 15, 2004 when she developed bilateral lower  quadrant abdominal cramping, nausea, and chills.  No vomiting and no  diarrhea.  She was noted to have a temperature of 102.   PAST MEDICAL HISTORY:  1.  Tourette's syndrome.  2.  ADD.  3.  Hypothyroidism.   HOSPITAL COURSE:  Problem 1:  GASTROINTESTINAL:  The patient presented with  probable colitis by CT, etiology of Crohn's versus ulcerative colitis were  not clear.  Stool for C. difficile is negative.  Stools were heme positive.  This prompted a GI evaluation.  The patient underwent a colonoscopy and on  March 21, 2004 colonoscopy from the cecum to the terminal ileus was normal.  There were a couple of patches of erythema and it was suspected that the  patient had resolving ischemic colitis.   DISCHARGE MEDICATIONS:  1.  Levothyroxine 75 mcg q.d.  2.  Topamax 25 mg b.i.d.  3.  Clonidine 1 mg b.i.d.  4.  Orap 1 mg b.i.d.  5.  Aviane 28 mg 1 q.d.  6.  Levsin q.4h. p.r.n.  7.  Imodium p.r.n.   FOLLOW UP:  Follow up with Dr. Idamae Schuller A. Tower in one to two weeks and Dr.  Iva Boop as needed.       LC/MEDQ  D:  05/17/2004  T:  05/17/2004  Job:  [id number]   cc:   Marne A. Milinda Antis, M.D. [facility]

## 2011-01-13 NOTE — Assessment & Plan Note (Signed)
[facility]                                 ON-CALL NOTE   NAME:[name], Rebecca MUSTON                    MRN:          [id number]  DATE:07/07/2008                            DOB:          19-May-1986    PATIENT OF:  Marne A. Tower, MD   Called [place] at [phone] at 9:13 p.m. on July 07, 2008, complaining of  severe headache and nausea.  I recommended her to go to the emergency  room to be evaluated.     Lelon Perla, DO  Electronically Signed    Shawnie Dapper  DD: 07/07/2008  DT: 07/08/2008  Job #: [id number]   cc:   Marne A. Milinda Antis, MD

## 2011-05-31 ENCOUNTER — Emergency Department (HOSPITAL_COMMUNITY)
Admission: EM | Admit: 2011-05-31 | Discharge: 2011-05-31 | Discharge status: Against Medical Advice | Payer: BC Managed Care – PPO | Attending: Emergency Medicine | Admitting: Emergency Medicine

## 2011-05-31 DIAGNOSIS — IMO0001 Reserved for inherently not codable concepts without codable children: Secondary | ICD-10-CM | POA: Insufficient documentation

## 2011-05-31 DIAGNOSIS — R5381 Other malaise: Secondary | ICD-10-CM | POA: Insufficient documentation

## 2011-05-31 DIAGNOSIS — R5383 Other fatigue: Secondary | ICD-10-CM | POA: Insufficient documentation

## 2011-05-31 DIAGNOSIS — R51 Headache: Secondary | ICD-10-CM | POA: Insufficient documentation

## 2011-05-31 LAB — POCT I-STAT, CHEM 8
Calcium, Ion: 1.26 mmol/L (ref 1.12–1.32)
Chloride: 107 mEq/L (ref 96–112)
HCT: 43 % (ref 36.0–46.0)
Potassium: 3.9 mEq/L (ref 3.5–5.1)

## 2013-04-01 ENCOUNTER — Encounter (HOSPITAL_COMMUNITY): Payer: Self-pay | Admitting: *Deleted

## 2013-04-01 DIAGNOSIS — E079 Disorder of thyroid, unspecified: Secondary | ICD-10-CM | POA: Insufficient documentation

## 2013-04-01 DIAGNOSIS — R1031 Right lower quadrant pain: Secondary | ICD-10-CM | POA: Insufficient documentation

## 2013-04-01 DIAGNOSIS — Z79899 Other long term (current) drug therapy: Secondary | ICD-10-CM | POA: Insufficient documentation

## 2013-04-01 DIAGNOSIS — I10 Essential (primary) hypertension: Secondary | ICD-10-CM | POA: Insufficient documentation

## 2013-04-01 DIAGNOSIS — Z3202 Encounter for pregnancy test, result negative: Secondary | ICD-10-CM | POA: Insufficient documentation

## 2013-04-01 LAB — URINALYSIS, ROUTINE W REFLEX MICROSCOPIC
Ketones, ur: NEGATIVE mg/dL
Leukocytes, UA: NEGATIVE
Nitrite: NEGATIVE
pH: 6 (ref 5.0–8.0)

## 2013-04-01 LAB — POCT PREGNANCY, URINE: Preg Test, Ur: NEGATIVE

## 2013-04-01 NOTE — ED Notes (Signed)
Pt in c/o RLQ abd pain since Sunday with chills, sent here from urgent care due to pain location and elevated WBC to r/o appendicitis, pt with printed copy of lab work

## 2013-04-02 ENCOUNTER — Encounter (HOSPITAL_COMMUNITY): Payer: Self-pay | Admitting: Radiology

## 2013-04-02 ENCOUNTER — Emergency Department (HOSPITAL_COMMUNITY): Payer: BC Managed Care – PPO

## 2013-04-02 ENCOUNTER — Emergency Department (HOSPITAL_COMMUNITY)
Admission: EM | Admit: 2013-04-02 | Discharge: 2013-04-02 | Disposition: A | Discharge status: Home / Self Care | Payer: BC Managed Care – PPO | Attending: Emergency Medicine | Admitting: Emergency Medicine

## 2013-04-02 DIAGNOSIS — R109 Unspecified abdominal pain: Secondary | ICD-10-CM

## 2013-04-02 HISTORY — DX: Disorder of thyroid, unspecified: E07.9

## 2013-04-02 HISTORY — DX: Essential (primary) hypertension: I10

## 2013-04-02 LAB — CBC WITH DIFFERENTIAL/PLATELET
Basophils Absolute: 0 10*3/uL (ref 0.0–0.1)
Eosinophils Relative: 1 % (ref 0–5)
Lymphocytes Relative: 25 % (ref 12–46)
MCV: 86.2 fL (ref 78.0–100.0)
Neutrophils Relative %: 68 % (ref 43–77)
Platelets: 227 10*3/uL (ref 150–400)
RBC: 4.34 MIL/uL (ref 3.87–5.11)
RDW: 13.7 % (ref 11.5–15.5)
WBC: 11.1 10*3/uL — ABNORMAL HIGH (ref 4.0–10.5)

## 2013-04-02 LAB — COMPREHENSIVE METABOLIC PANEL
ALT: 13 U/L (ref 0–35)
AST: 21 U/L (ref 0–37)
Alkaline Phosphatase: 67 U/L (ref 39–117)
CO2: 22 mEq/L (ref 19–32)
Calcium: 9.5 mg/dL (ref 8.4–10.5)
GFR calc non Af Amer: >90 mL/min (ref >90)
Potassium: 4 mEq/L (ref 3.5–5.1)
Sodium: 140 mEq/L (ref 135–145)
Total Protein: 7.6 g/dL (ref 6.0–8.3)

## 2013-04-02 MED ORDER — IOHEXOL 300 MG/ML  SOLN
100.0000 mL | Freq: Once | INTRAMUSCULAR | Status: AC | PRN
  Administered 2013-04-02: 100 mL via INTRAVENOUS

## 2013-04-02 NOTE — ED Provider Notes (Signed)
CSN: [id number]     Arrival date & time 04/01/13  2130 History     First MD Initiated Contact with Patient 04/02/13 0302     Chief Complaint  Patient presents with  . Abdominal Pain   (Consider location/radiation/quality/duration/timing/severity/associated sxs/prior Treatment) HPI This 27 year old female is 3 days of intermittent right lower quadrant pain without associated symptoms, 2 days ago she had intermittent sudden sharp pains, yesterday she had constant pain all day, today she has had intermittent pain to the right lower quadrant with 2 sudden sharp crampy episodes but no pain now although she still tender at the right lower quadrant so was sent to the ED by the urgent care to rule out appendicitis, she has no fever chest pain shortness of breath nausea vomiting diarrhea dysuria vaginal bleeding vaginal discharge  Past Medical History  Diagnosis Date  . Hypertension   . Thyroid disease    History reviewed. No pertinent past surgical history. History reviewed. No pertinent family history. History  Substance Use Topics  . Smoking status: Not on file  . Smokeless tobacco: Not on file  . Alcohol Use: Not on file   OB History   Grav Para Term Preterm Abortions TAB SAB Ect Mult Living                 Review of Systems 10 Systems reviewed and are negative for acute change except as noted in the HPI. Allergies  Diphenhydramine hcl; Promethazine hcl; Pseudoephedrine; Sulfonamide derivatives; and Zithromax  Home Medications   Current Outpatient Rx  Name  Route  Sig  Dispense  Refill  . clonazePAM (KLONOPIN) 1 MG tablet   Oral   Take 1 mg by mouth 3 (three) times daily as needed for anxiety.         . docusate sodium (COLACE) 100 MG capsule   Oral   Take 200 mg by mouth daily as needed for constipation.         [name] ibuprofen (ADVIL,MOTRIN) 200 MG tablet   Oral   Take 400 mg by mouth daily as needed for headache.         . levothyroxine (SYNTHROID, LEVOTHROID) 75  MCG tablet   Oral   Take 75 mcg by mouth daily before breakfast.         . lisinopril (PRINIVIL,ZESTRIL) 20 MG tablet   Oral   Take 20 mg by mouth daily.         . Multiple Vitamins-Minerals (MULTIVITAMIN PO)   Oral   Take 1 tablet by mouth daily.         . pimozide (ORAP) 2 MG tablet   Oral   Take 2 mg by mouth daily.         . sertraline (ZOLOFT) 50 MG tablet   Oral   Take 50 mg by mouth daily.         [name] topiramate (TOPAMAX) 25 MG tablet   Oral   Take 25 mg by mouth daily.          BP 113/73  Pulse 72  Temp(Src) 97.9 F (36.6 C) (Oral)  Resp 16  SpO2 100%  LMP 03/19/2013 Physical Exam  Nursing note and vitals reviewed. Constitutional:  Awake, alert, nontoxic appearance.  HENT:  Head: Atraumatic.  Eyes: Right eye exhibits no discharge. Left eye exhibits no discharge.  Neck: Neck supple.  Cardiovascular: Normal rate and regular rhythm.   No murmur heard. Pulmonary/Chest: Effort normal and breath sounds normal. No respiratory distress. She  has no wheezes. She has no rales. She exhibits no tenderness.  Abdominal: Soft. Bowel sounds are normal. She exhibits no distension and no mass. There is tenderness. There is no rebound and no guarding.  mild right lower quadrant tenderness with bimanual examination nontender (chaparone present) no blood or discharge on bimanual examination glove  Musculoskeletal: She exhibits no edema and no tenderness.  Baseline ROM, no obvious new focal weakness.  Neurological: She is alert.  Mental status and motor strength appears baseline for patient and situation.  Skin: No rash noted.  Psychiatric: She has a normal mood and affect.    ED Course   Procedures (including critical care time) Patient / Family / Caregiver informed of clinical course, understand medical decision-making process, and agree with plan. Labs Reviewed  URINALYSIS, ROUTINE W REFLEX MICROSCOPIC - Abnormal; Notable for the following:    APPearance  CLOUDY (*)    All other components within normal limits  CBC WITH DIFFERENTIAL - Abnormal; Notable for the following:    WBC 11.1 (*)    All other components within normal limits  COMPREHENSIVE METABOLIC PANEL - Abnormal; Notable for the following:    Total Bilirubin 0.2 (*)    All other components within normal limits  LIPASE, BLOOD  POCT PREGNANCY, URINE   Ct Abdomen Pelvis W Contrast  04/02/2013   *RADIOLOGY REPORT*  Clinical Data: Diffuse abdominal pain.  CT ABDOMEN AND PELVIS WITH CONTRAST  Technique:  Multidetector CT imaging of the abdomen and pelvis was performed following the standard protocol during bolus administration of intravenous contrast.  Contrast: OMNIPAQUE IOHEXOL 300 MG/ML  SOLN  Comparison: CT abdomen and pelvis 03/18/2004.  Findings: The lung bases are clear.  No pleural or pericardial effusion.  The patient is status post cholecystectomy.  The liver, spleen, adrenal glands, pancreas, biliary tree and kidneys all appear normal.  Uterus, adnexa and urinary bladder are unremarkable.  A small amount of free pelvic fluid is compatible with physiologic change.  The stomach, small and large bowel and appendix appear normal.  No lymphadenopathy is identified.  The patient has bilateral L5 pars interarticularis defects without associated anterolisthesis of L5 on S1.  IMPRESSION:  1.  Negative for appendicitis or other acute abnormality. 2.  Bilateral L5 pars interarticularis defects without anterolisthesis of L5 on S1.   Original Report Authenticated By: Holley Dexter, M.D.   1. Abdominal pain     MDM  I doubt any other [facility] precluding discharge at this time including, but not necessarily limited to the following: Acute appendicitis, ovarian torsion, SBI.  Hurman Horn, MD 04/02/13 [phone]

## 2013-04-02 NOTE — ED Notes (Signed)
Spoked with Dr. Fonnie Jarvis who is on the way to see the patient in a few minutes

## 2013-04-02 NOTE — ED Notes (Signed)
Patient transported to CT 

## 2013-04-02 NOTE — ED Notes (Signed)
MD at bedside. 

## 2013-06-09 ENCOUNTER — Ambulatory Visit: Payer: BC Managed Care – PPO | Admitting: *Deleted

## 2013-06-09 DIAGNOSIS — Z23 Encounter for immunization: Secondary | ICD-10-CM

## 2013-07-22 ENCOUNTER — Encounter: Payer: Self-pay | Admitting: Nurse Practitioner

## 2013-07-22 ENCOUNTER — Ambulatory Visit (INDEPENDENT_AMBULATORY_CARE_PROVIDER_SITE_OTHER): Payer: BC Managed Care – PPO | Admitting: Nurse Practitioner

## 2013-07-22 VITALS — BP 116/70 | HR 81 | Wt 166.0 lb

## 2013-07-22 DIAGNOSIS — J069 Acute upper respiratory infection, unspecified: Secondary | ICD-10-CM

## 2013-07-22 MED ORDER — ACETAMINOPHEN-CODEINE #3 300-30 MG PO TABS: 1.0000 | ORAL_TABLET | ORAL | Status: DC | PRN

## 2013-07-22 MED ORDER — AZITHROMYCIN 250 MG PO TABS: ORAL_TABLET | ORAL | Status: DC

## 2013-07-22 MED ORDER — ALBUTEROL SULFATE HFA 108 (90 BASE) MCG/ACT IN AERS: 1.0000 | INHALATION_SPRAY | Freq: Four times a day (QID) | RESPIRATORY_TRACT | Status: DC | PRN

## 2013-07-22 NOTE — Progress Notes (Signed)
History:  Rebecca [name]  is a 27 y.o. No obstetric history on file. who presents to clinic today for  symptoms of an upper respiratory infection. She has had a productive cough for 10 days, unable to determine color of sputum. Denies fever but her husband has same symptoms and has fever. She has wheezes at night only. Her main concern is that she has had same symptoms in past that have twice lead to pneumonia. She did get her flu shot.   The following portions of the patient's history were reviewed and updated as appropriate: allergies, current medications, past family history, past medical history, past social history, past surgical history and problem list.  Review of Systems:  Pertinent items are noted in HPI.  Objective:  Physical Exam BP 116/70  Pulse 81  Wt 166 lb (75.297 kg) GENERAL: Well-developed, well-nourished female in no acute distress.  HEENT: Normocephalic, atraumatic. Bilateral TMs are red. NECK: Supple. Normal thyroid. Positive for cervical lymphadenopathy LUNGS: Normal rate. Clear to auscultation bilaterally.  HEART: Regular rate and rhythm with no adventitious sounds.   EXTREMITIES: No cyanosis, clubbing, or edema, 2+ distal pulses.   Labs and Imaging No results found.  Assessment & Plan:  Assessment:  URI  Plans:  Z-Pack as directed ( her allergy is drug interaction ) Proventil Inhaler Tylenol #3 po q4-6 hours prn cough Increase fluids and rest RTC PRN   Carolynn Serve, NP 07/22/2013 2:48 PM

## 2013-07-22 NOTE — Patient Instructions (Signed)

## 2013-08-15 ENCOUNTER — Other Ambulatory Visit: Payer: BC Managed Care – PPO

## 2013-08-15 DIAGNOSIS — N898 Other specified noninflammatory disorders of vagina: Secondary | ICD-10-CM

## 2013-08-16 LAB — WET PREP FOR TRICH, YEAST, CLUE: Trich, Wet Prep: NONE SEEN

## 2013-08-18 NOTE — Progress Notes (Signed)
Spoke with patient, her discharge is much better she does not wish to take the medication.

## 2013-12-01 ENCOUNTER — Ambulatory Visit (INDEPENDENT_AMBULATORY_CARE_PROVIDER_SITE_OTHER): Payer: BC Managed Care – PPO | Admitting: Family Medicine

## 2013-12-01 ENCOUNTER — Encounter: Payer: Self-pay | Admitting: Family Medicine

## 2013-12-01 DIAGNOSIS — N9089 Other specified noninflammatory disorders of vulva and perineum: Secondary | ICD-10-CM

## 2013-12-01 DIAGNOSIS — N907 Vulvar cyst: Secondary | ICD-10-CM

## 2013-12-01 NOTE — Progress Notes (Signed)
    Subjective:    Patient ID: Rebecca OrmondChrissie C [name] is a 28 y.o. female presenting with No chief complaint on file.  on 12/01/2013  HPI: Noted ingrown hair x 9 months.  Has been picking at it and removed hair, but hard nodule remains.  Has had increasing pain and erythema recently.  Review of Systems  Constitutional: Negative for fever and chills.  Respiratory: Negative for shortness of breath.   Cardiovascular: Negative for chest pain.  Gastrointestinal: Negative for nausea, vomiting and abdominal pain.  Genitourinary: Negative for dysuria.  Skin: Negative for rash.      Objective:    There were no vitals taken for this visit. Physical Exam  Constitutional: She is oriented to person, place, and time. She appears well-developed and well-nourished. No distress.  HENT:  Head: Normocephalic and atraumatic.  Eyes: No scleral icterus.  Neck: Neck supple.  Cardiovascular: Normal rate.   Pulmonary/Chest: Effort normal.  Abdominal: Soft.  Genitourinary:     Neurological: She is alert and oriented to person, place, and time.  Skin: Skin is warm and dry.  Psychiatric: She has a normal mood and affect.   Procedure: After informed consent.  Area was anesthetized with Lidocaine, cleansed with Betadine.  Incision made and area removed with hemostat and Addison's.  All hard, firm tissue excised.  Incision repaired with 3-0 Vicryl in 3 interrupted sutures.  Patient tolerated the procedure well.      Assessment & Plan:    No Follow-up on file.

## 2013-12-30 ENCOUNTER — Other Ambulatory Visit: Payer: BC Managed Care – PPO | Admitting: *Deleted

## 2013-12-30 DIAGNOSIS — E559 Vitamin D deficiency, unspecified: Secondary | ICD-10-CM

## 2014-01-04 LAB — VITAMIN D 1,25 DIHYDROXY
Vitamin D 1, 25 (OH)2 Total: 59 pg/mL (ref 18–72)
Vitamin D3 1, 25 (OH)2: 59 pg/mL

## 2014-01-06 ENCOUNTER — Encounter: Payer: Self-pay | Admitting: Nurse Practitioner

## 2014-01-06 ENCOUNTER — Ambulatory Visit (INDEPENDENT_AMBULATORY_CARE_PROVIDER_SITE_OTHER): Payer: BC Managed Care – PPO | Admitting: Nurse Practitioner

## 2014-01-06 VITALS — BP 122/71 | HR 93 | Ht 65.0 in | Wt 173.0 lb

## 2014-01-06 DIAGNOSIS — J069 Acute upper respiratory infection, unspecified: Secondary | ICD-10-CM

## 2014-01-06 MED ORDER — ALBUTEROL SULFATE HFA 108 (90 BASE) MCG/ACT IN AERS: 1.0000 | INHALATION_SPRAY | Freq: Four times a day (QID) | RESPIRATORY_TRACT | Status: DC | PRN

## 2014-01-06 MED ORDER — HYDROCOD POLST-CHLORPHEN POLST 10-8 MG/5ML PO LQCR: 5.0000 mL | Freq: Two times a day (BID) | ORAL | Status: DC | PRN

## 2014-01-06 MED ORDER — AZITHROMYCIN 250 MG PO TABS: ORAL_TABLET | ORAL | Status: AC

## 2014-01-06 NOTE — Patient Instructions (Signed)

## 2014-01-06 NOTE — Progress Notes (Signed)
History:  Rebecca [name] is a 28 y.o. No obstetric history on file. who presents to clinic today for symptoms of URI that have been ongoing for over 2 weeks and are getting worse. She is coughing up green sputum and wheezing at night. She is coughing at night and unable to sleep. She has had fever with chills off and on. She was seen at Urgent Care and told it was allergies and given nasal spray. She has been on multiple allergy medication with no relief.    The following portions of the patient's history were reviewed and updated as appropriate: allergies, current medications, past family history, past medical history, past social history, past surgical history and problem list.  Review of Systems:  Pertinent items are noted in HPI.  Objective:  Physical Exam There were no vitals taken for this visit. GENERAL: Well-developed, well-nourished female in no acute distress.  HEENT: Normocephalic, atraumatic. Bilateral ear redness R>L NECK: Supple. Positive cervical lymph node enlargement LUNGS: Normal rate. Scattered rhonchi and wheezes HEART: Regular rate and rhythm with no adventitious sounds.  EXTREMITIES: No cyanosis, clubbing, or edema, 2+ distal pulses.   Labs and Imaging No results found.  Assessment & Plan:  Assessment:  URI  Plan:  ZPack as directed. Pt will stop her Tourettes medication while on zithromax Tussionex for cough Refill Proventil Inhaler and use as needed Follow up as needed  Rebecca PhenixLinda M Ciin Brazzel, NP 01/06/2014 1:52 PM

## 2014-04-16 ENCOUNTER — Telehealth: Payer: Self-pay | Admitting: *Deleted

## 2014-04-16 DIAGNOSIS — N390 Urinary tract infection, site not specified: Secondary | ICD-10-CM

## 2014-04-16 MED ORDER — PHENAZOPYRIDINE HCL 200 MG PO TABS: 200.0000 mg | ORAL_TABLET | Freq: Three times a day (TID) | ORAL | Status: DC | PRN

## 2014-04-16 MED ORDER — CIPROFLOXACIN HCL 500 MG PO TABS: 500.0000 mg | ORAL_TABLET | Freq: Two times a day (BID) | ORAL | Status: AC

## 2014-04-16 NOTE — Telephone Encounter (Signed)
Pt is having symptoms of a UTI.  I have sent in an antibiotic and Pyridium.

## 2014-05-12 ENCOUNTER — Telehealth: Payer: Self-pay | Admitting: *Deleted

## 2014-05-12 DIAGNOSIS — N390 Urinary tract infection, site not specified: Secondary | ICD-10-CM

## 2014-05-12 MED ORDER — CIPROFLOXACIN HCL 500 MG PO TABS: 500.0000 mg | ORAL_TABLET | Freq: Two times a day (BID) | ORAL | Status: DC

## 2014-05-12 NOTE — Telephone Encounter (Signed)
Patient has another urinary tract infection.  We will collect culture and call in medication for her.

## 2014-05-14 LAB — CULTURE, URINE COMPREHENSIVE
COLONY COUNT: NO GROWTH
ORGANISM ID, BACTERIA: NO GROWTH

## 2014-07-14 ENCOUNTER — Other Ambulatory Visit: Payer: Self-pay | Admitting: Advanced Practice Midwife

## 2014-07-14 MED ORDER — VALACYCLOVIR HCL 1 G PO TABS: 1000.0000 mg | ORAL_TABLET | Freq: Every day | ORAL | Status: DC

## 2014-07-14 MED ORDER — VALACYCLOVIR HCL 1 G PO TABS: 1000.0000 mg | ORAL_TABLET | Freq: Two times a day (BID) | ORAL | Status: DC

## 2014-07-14 MED ORDER — TERCONAZOLE 80 MG VA SUPP: 80.0000 mg | Freq: Every day | VAGINAL | Status: DC

## 2014-07-14 NOTE — Addendum Note (Signed)
Addended by: Sharen CounterLEFTWICH-KIRBY, LISA A on: 07/14/2014 04:26 PM   Modules accepted: Orders, Medications

## 2014-08-07 ENCOUNTER — Telehealth: Payer: Self-pay | Admitting: *Deleted

## 2014-08-07 MED ORDER — FLIBANSERIN 100 MG PO TABS: 1.0000 | ORAL_TABLET | Freq: Every day | ORAL | Status: DC

## 2014-08-07 NOTE — Telephone Encounter (Signed)
Call in Addyi per Dr. Marice Potterove.

## 2014-10-20 ENCOUNTER — Telehealth: Payer: Self-pay | Admitting: *Deleted

## 2014-10-20 DIAGNOSIS — J069 Acute upper respiratory infection, unspecified: Secondary | ICD-10-CM

## 2014-10-20 MED ORDER — AZITHROMYCIN 250 MG PO TABS
250.0000 mg | ORAL_TABLET | Freq: Every day | ORAL | Status: DC
Start: 2014-10-20 — End: 2015-10-26

## 2014-10-20 NOTE — Telephone Encounter (Signed)
Patient has had uri symptoms and throat pain for 10 days now.  She would like antibiotics called in for her.

## 2014-12-16 ENCOUNTER — Telehealth: Payer: Self-pay

## 2014-12-16 NOTE — Telephone Encounter (Signed)
Called Z-pack for patient has a sinus infection can't get rid off. Called it in to [facility].

## 2014-12-22 ENCOUNTER — Other Ambulatory Visit: Payer: BLUE CROSS/BLUE SHIELD | Admitting: *Deleted

## 2014-12-22 DIAGNOSIS — N898 Other specified noninflammatory disorders of vagina: Secondary | ICD-10-CM

## 2014-12-23 LAB — WET PREP, GENITAL
TRICH WET PREP: NONE SEEN
YEAST WET PREP: NONE SEEN

## 2015-03-01 ENCOUNTER — Telehealth: Payer: Self-pay | Admitting: *Deleted

## 2015-03-01 DIAGNOSIS — B9689 Other specified bacterial agents as the cause of diseases classified elsewhere: Secondary | ICD-10-CM

## 2015-03-01 DIAGNOSIS — N760 Acute vaginitis: Principal | ICD-10-CM

## 2015-03-01 MED ORDER — METRONIDAZOLE 500 MG PO TABS
500.0000 mg | ORAL_TABLET | Freq: Two times a day (BID) | ORAL | Status: DC
Start: 2015-03-01 — End: 2015-09-16

## 2015-03-01 NOTE — Telephone Encounter (Signed)
Sent Flagyl to [facility] per pt req

## 2015-03-03 ENCOUNTER — Other Ambulatory Visit: Payer: BLUE CROSS/BLUE SHIELD | Admitting: Obstetrics and Gynecology

## 2015-03-03 DIAGNOSIS — N898 Other specified noninflammatory disorders of vagina: Secondary | ICD-10-CM

## 2015-03-04 LAB — WET PREP, GENITAL
Clue Cells Wet Prep HPF POC: NONE SEEN
TRICH WET PREP: NONE SEEN
YEAST WET PREP: NONE SEEN

## 2015-03-06 ENCOUNTER — Other Ambulatory Visit: Payer: Self-pay | Admitting: Family Medicine

## 2015-03-06 MED ORDER — CYCLOBENZAPRINE HCL 10 MG PO TABS: 10.0000 mg | ORAL_TABLET | Freq: Three times a day (TID) | ORAL | Status: DC | PRN

## 2015-03-06 NOTE — Progress Notes (Signed)
Pt called with back spasms following moving furniture. Flexeril rx sent in.

## 2015-09-01 ENCOUNTER — Other Ambulatory Visit: Payer: 59 | Admitting: *Deleted

## 2015-09-01 ENCOUNTER — Other Ambulatory Visit: Payer: Self-pay | Admitting: Obstetrics and Gynecology

## 2015-09-01 DIAGNOSIS — N898 Other specified noninflammatory disorders of vagina: Secondary | ICD-10-CM

## 2015-09-01 MED ORDER — TERCONAZOLE 0.4 % VA CREA: 1.0000 | TOPICAL_CREAM | Freq: Every day | VAGINAL | Status: DC

## 2015-09-01 NOTE — Progress Notes (Signed)
Patient to swab herself and will send off wet prep.  Patient having vaginal discharge and itching.

## 2015-09-02 ENCOUNTER — Telehealth: Payer: Self-pay | Admitting: *Deleted

## 2015-09-02 DIAGNOSIS — N760 Acute vaginitis: Principal | ICD-10-CM

## 2015-09-02 DIAGNOSIS — B9689 Other specified bacterial agents as the cause of diseases classified elsewhere: Secondary | ICD-10-CM

## 2015-09-02 LAB — WET PREP, GENITAL
TRICH WET PREP: NONE SEEN
WBC WET PREP: NONE SEEN

## 2015-09-02 MED ORDER — METRONIDAZOLE 500 MG PO TABS: 500.0000 mg | ORAL_TABLET | Freq: Two times a day (BID) | ORAL | Status: DC

## 2015-09-02 NOTE — Telephone Encounter (Signed)
Wet prep came back with few clue cells indicative of BV, pt experiencing symptoms of BV.  Flagyl sent to pharmacy.

## 2015-09-10 ENCOUNTER — Encounter: Payer: 59 | Attending: Family Medicine | Admitting: Dietician

## 2015-09-10 VITALS — Ht 65.0 in | Wt 200.0 lb

## 2015-09-10 DIAGNOSIS — Z713 Dietary counseling and surveillance: Secondary | ICD-10-CM | POA: Insufficient documentation

## 2015-09-10 DIAGNOSIS — E669 Obesity, unspecified: Secondary | ICD-10-CM

## 2015-09-10 NOTE — Progress Notes (Signed)
Notes from [employer] employee "self referral" nutrition session: Start time: 1330   End [phone]  Met with employee to discuss his/her nutritional concerns and diet history. The employee's questions/concerns were also addressed.  She voices concern over 30lb weight gain in the past year, but is not sure how to start making changes. She reports having difficulty trying to eat less.   She reports frequent fast food meals, eating something sweet after every meal, and drinking some regular sodas and sweet tea.  She is not yet ready to work on increasing physical activity.  We discussed the following topics:  Healthy Eating  Exercise  Weight Concerns   I also provided the following handouts as reinforcement of the educational session:  Planning a Balanced Meal  Food Guide Plate: 1-page meal planner  Sample menus and/or recipes: Quick and Healthy Meal Ideas   Additional Comments:  Patient feels she can start working on breakfast and supper meals more easily than lunch.   Discussed strategies for portion control, reducing calories, and decreasing intake of sweets.   Instructed on basic meal planning for 1400kcal daily intake.  Encouraged her to use MyFitnessPal app, which she has on her phone, to track intake.    Goals Agreed Upon: Listed in Patient Instructions  Follow-up: Patient will obtain MD referral at her next appointment on 10/29/15, and will schedule follow-up then.

## 2015-09-10 NOTE — Patient Instructions (Signed)
   Take breakfast to work: breakfast drink with protein, milk or yogurt, and fruit; or light breakfast sandwich (english muffin or whole grain bagel) and a fruit; or whole grain cereal like Special K, Cheerios, Wheaties, mini wheats with 2% milk.  Eat more supper meals at home. Plan ahead to have foods bought and ready, consider afternoon and evening activities that limit time.   Keep portions of starchy foods to 1 cup (fist-size) or less with each meal. Try using a small plate and chew each bite thoroughly to eat slowly.

## 2015-09-16 ENCOUNTER — Other Ambulatory Visit (INDEPENDENT_AMBULATORY_CARE_PROVIDER_SITE_OTHER): Payer: 59 | Admitting: *Deleted

## 2015-09-16 DIAGNOSIS — N898 Other specified noninflammatory disorders of vagina: Secondary | ICD-10-CM

## 2015-09-16 DIAGNOSIS — A499 Bacterial infection, unspecified: Secondary | ICD-10-CM

## 2015-09-16 DIAGNOSIS — N760 Acute vaginitis: Secondary | ICD-10-CM | POA: Diagnosis not present

## 2015-09-16 DIAGNOSIS — B9689 Other specified bacterial agents as the cause of diseases classified elsewhere: Secondary | ICD-10-CM

## 2015-09-16 MED ORDER — TERCONAZOLE 0.4 % VA CREA
1.0000 | TOPICAL_CREAM | Freq: Every day | VAGINAL | Status: DC
Start: 2015-09-16 — End: 2015-10-26

## 2015-09-16 MED ORDER — METRONIDAZOLE 500 MG PO TABS: 500.0000 mg | ORAL_TABLET | Freq: Two times a day (BID) | ORAL | Status: DC

## 2015-09-16 NOTE — Progress Notes (Signed)
Pt c/o vaginal discharge, wet prep a week ago + for yeast and BV, pt still symptomatic.  Dr Jolayne Panther recommended another dose of treatment.

## 2015-09-17 LAB — WET PREP, GENITAL
Trich, Wet Prep: NONE SEEN
WBC WET PREP: NONE SEEN
Yeast Wet Prep HPF POC: NONE SEEN

## 2015-09-20 ENCOUNTER — Ambulatory Visit: Payer: BLUE CROSS/BLUE SHIELD | Admitting: Family Medicine

## 2015-10-26 ENCOUNTER — Ambulatory Visit (INDEPENDENT_AMBULATORY_CARE_PROVIDER_SITE_OTHER): Payer: BLUE CROSS/BLUE SHIELD | Admitting: Urgent Care

## 2015-10-26 VITALS — BP 127/91 | HR 89 | Temp 98.6°F | Resp 18 | Ht 65.0 in | Wt 201.0 lb

## 2015-10-26 DIAGNOSIS — S0003XA Contusion of scalp, initial encounter: Secondary | ICD-10-CM | POA: Diagnosis not present

## 2015-10-26 DIAGNOSIS — Z23 Encounter for immunization: Secondary | ICD-10-CM | POA: Diagnosis not present

## 2015-10-26 DIAGNOSIS — S0101XA Laceration without foreign body of scalp, initial encounter: Secondary | ICD-10-CM

## 2015-10-26 DIAGNOSIS — F419 Anxiety disorder, unspecified: Secondary | ICD-10-CM | POA: Diagnosis not present

## 2015-10-26 NOTE — Progress Notes (Addendum)
    MRN: [id number] DOB: June 07, 1986  Subjective:   Rebecca [name] is a 30 y.o. female presenting for chief complaint of Other  Reports that she was vacuuming stairs today and her vacuum cleaner fell over her head. Patient washed her wound immediately, used peroxide as well. She denies loss of consciousness, dizziness, confusion, double vision, weakness, numbness or tingling. Denies smoking cigarettes.  Corina has a current medication list which includes the following prescription(s): clonazepam, levothyroxine, multiple vitamins-minerals, pimozide, topiramate, and valsartan. Also is allergic to diphenhydramine hcl; promethazine hcl; pseudoephedrine; sulfonamide derivatives; and zithromax.  Teralyn  has a past medical history of Hypertension; Thyroid disease; and Tourette's syndrome. Also  has past surgical history that includes Cholecystectomy; Cesarean section; and Wisdom tooth extraction.  Her family history includes ADD / ADHD in her son; Asthma in her son; Cancer in her paternal grandfather; Heart attack in her father; Heart disease in her father; Hypertension in her father and paternal grandmother.   Objective:   Vitals: BP 127/91 mmHg  Pulse 89  Temp(Src) 98.6 F (37 C) (Oral)  Resp 18  Ht  (1.651 m)  Wt 201 lb (91.173 kg)  BMI 33.45 kg/m2  SpO2 98%  Physical Exam  Constitutional: She is oriented to person, place, and time. She appears well-developed and well-nourished.  HENT:  Head:    Mouth/Throat: Oropharynx is clear and moist.  Eyes: EOM are normal. Pupils are equal, round, and reactive to light. No scleral icterus.  Neck: Normal range of motion. Neck supple.  Cardiovascular: Normal rate, regular rhythm and intact distal pulses.  Exam reveals no gallop and no friction rub.   No murmur heard. Pulmonary/Chest: No respiratory distress. She has no wheezes. She has no rales.  Neurological: She is alert and oriented to person, place, and time. No cranial nerve  deficit. Coordination normal.  Skin: Skin is warm and dry.   Laceration Repair: Procedure performed with Nurse Practitioner Student Dora under my direct supervision. Verbal consent obtained. ~3cc of 1% lidocaine with epinephrine was used for local anaesthesia. Wound was cleaned with soap and water. Wound depth found to be very superficial. 3 staples were used to approximate wound. Cleansed and dressed.  Assessment and Plan :   1. Scalp laceration, initial encounter 2. Scalp contusion, initial encounter - Stable, physical exam findings reassuring. RTC in 7-10 days for staple removal. Counseled on wound care extensively. RTC before 1 week if signs of infection develop.  2. Need for Tdap vaccination - Tdap vaccine greater than or equal to 7yo IM   Wallis Bamberg, PA-C [facility] [phone] 10/26/2015 10:21 AM

## 2015-10-26 NOTE — Patient Instructions (Signed)
Laceration Care, Adult A laceration is a cut that goes through all of the layers of the skin and into the tissue that is right under the skin. Some lacerations heal on their own. Others need to be closed with stitches (sutures), staples, skin adhesive strips, or skin glue. Proper laceration care minimizes the risk of infection and helps the laceration to heal better. HOW TO CARE FOR YOUR LACERATION If sutures or staples were used:  Keep the wound clean and dry.  If you were given a bandage (dressing), you should change it at least one time per day or as told by your health care provider. You should also change it if it becomes wet or dirty.  Keep the wound completely dry for the first 24 hours or as told by your health care provider. After that time, you may shower or bathe. However, make sure that the wound is not soaked in water until after the sutures or staples have been removed.  Clean the wound one time each day or as told by your health care provider:  Wash the wound with soap and water.  Rinse the wound with water to remove all soap.  Pat the wound dry with a clean towel. Do not rub the wound.  After cleaning the wound, apply a thin layer of antibiotic ointmentas told by your health care provider. This will help to prevent infection and keep the dressing from sticking to the wound.  Have the sutures or staples removed as told by your health care provider. If skin adhesive strips were used:  Keep the wound clean and dry.  If you were given a bandage (dressing), you should change it at least one time per day or as told by your health care provider. You should also change it if it becomes dirty or wet.  Do not get the skin adhesive strips wet. You may shower or bathe, but be careful to keep the wound dry.  If the wound gets wet, pat it dry with a clean towel. Do not rub the wound.  Skin adhesive strips fall off on their own. You may trim the strips as the wound heals. Do not  remove skin adhesive strips that are still stuck to the wound. They will fall off in time. If skin glue was used:  Try to keep the wound dry, but you may briefly wet it in the shower or bath. Do not soak the wound in water, such as by swimming.  After you have showered or bathed, gently pat the wound dry with a clean towel. Do not rub the wound.  Do not do any activities that will make you sweat heavily until the skin glue has fallen off on its own.  Do not apply liquid, cream, or ointment medicine to the wound while the skin glue is in place. Using those may loosen the film before the wound has healed.  If you were given a bandage (dressing), you should change it at least one time per day or as told by your health care provider. You should also change it if it becomes dirty or wet.  If a dressing is placed over the wound, be careful not to apply tape directly over the skin glue. Doing that may cause the glue to be pulled off before the wound has healed.  Do not pick at the glue. The skin glue usually remains in place for 5-10 days, then it falls off of the skin. General Instructions  Take over-the-counter and prescription   medicines only as told by your health care provider.  If you were prescribed an antibiotic medicine or ointment, take or apply it as told by your doctor. Do not stop using it even if your condition improves.  To help prevent scarring, make sure to cover your wound with sunscreen whenever you are outside after stitches are removed, after adhesive strips are removed, or when glue remains in place and the wound is healed. Make sure to wear a sunscreen of at least 30 SPF.  Do not scratch or pick at the wound.  Keep all follow-up visits as told by your health care provider. This is important.  Check your wound every day for signs of infection. Watch for:  Redness, swelling, or pain.  Fluid, blood, or pus.  Raise (elevate) the injured area above the level of your heart  while you are sitting or lying down, if possible. SEEK MEDICAL CARE IF:  You received a tetanus shot and you have swelling, severe pain, redness, or bleeding at the injection site.  You have a fever.  A wound that was closed breaks open.  You notice a bad smell coming from your wound or your dressing.  You notice something coming out of the wound, such as wood or glass.  Your pain is not controlled with medicine.  You have increased redness, swelling, or pain at the site of your wound.  You have fluid, blood, or pus coming from your wound.  You notice a change in the color of your skin near your wound.  You need to change the dressing frequently due to fluid, blood, or pus draining from the wound.  You develop a new rash.  You develop numbness around the wound. SEEK IMMEDIATE MEDICAL CARE IF:  You develop severe swelling around the wound.  Your pain suddenly increases and is severe.  You develop painful lumps near the wound or on skin that is anywhere on your body.  You have a red streak going away from your wound.  The wound is on your hand or foot and you cannot properly move a finger or toe.  The wound is on your hand or foot and you notice that your fingers or toes look pale or bluish.   This information is not intended to replace advice given to you by your health care provider. Make sure you discuss any questions you have with your health care provider.   Document Released: 08/14/2005 Document Revised: 12/29/2014 Document Reviewed: 08/10/2014 Elsevier Interactive Patient Education 2016 [employer] Inc[employer]    Facial or Scalp Contusion A facial or scalp contusion is a deep bruise on the face or head. Injuries to the face and head generally cause a lot of swelling, especially around the eyes. Contusions are the result of an injury that caused bleeding under the skin. The contusion may turn blue, purple, or yellow. Minor injuries will give you a painless contusion, but  more severe contusions may stay painful and swollen for a few weeks.  CAUSES  A facial or scalp contusion is caused by a blunt injury or trauma to the face or head area.  SIGNS AND SYMPTOMS   Swelling of the injured area.   Discoloration of the injured area.   Tenderness, soreness, or pain in the injured area.  DIAGNOSIS  The diagnosis can be made by taking a medical history and doing a physical exam. An X-ray exam, CT scan, or MRI may be needed to determine if there are any associated injuries, such as  broken bones (fractures). TREATMENT  Often, the best treatment for a facial or scalp contusion is applying cold compresses to the injured area. Over-the-counter medicines may also be recommended for pain control.  HOME CARE INSTRUCTIONS   Only take over-the-counter or prescription medicines as directed by your health care provider.   Apply ice to the injured area.   Put ice in a plastic bag.   Place a towel between your skin and the bag.   Leave the ice on for 20 minutes, 2-3 times a day.  SEEK MEDICAL CARE IF:  You have bite problems.   You have pain with chewing.   You are concerned about facial defects. SEEK IMMEDIATE MEDICAL CARE IF:  You have severe pain or a headache that is not relieved by medicine.   You have unusual sleepiness, confusion, or personality changes.   You throw up (vomit).   You have a persistent nosebleed.   You have double vision or blurred vision.   You have fluid drainage from your nose or ear.   You have difficulty walking or using your arms or legs.  MAKE SURE YOU:   Understand these instructions.  Will watch your condition.  Will get help right away if you are not doing well or get worse.   This information is not intended to replace advice given to you by your health care provider. Make sure you discuss any questions you have with your health care provider.   Document Released: 09/21/2004 Document Revised:  09/04/2014 Document Reviewed: 03/27/2013 Elsevier Interactive Patient Education [employer]

## 2015-11-04 ENCOUNTER — Ambulatory Visit (INDEPENDENT_AMBULATORY_CARE_PROVIDER_SITE_OTHER): Payer: BLUE CROSS/BLUE SHIELD | Admitting: Physician Assistant

## 2015-11-04 VITALS — BP 120/80 | HR 112 | Temp 98.3°F | Resp 20 | Ht 65.0 in | Wt 201.0 lb

## 2015-11-04 DIAGNOSIS — S0101XD Laceration without foreign body of scalp, subsequent encounter: Secondary | ICD-10-CM

## 2015-11-04 NOTE — Progress Notes (Signed)
[facility] [address] [place] [address] [phone]  Date:  11/04/2015   Name:  Rebecca [name]   DOB:  October 23, 1985   MRN:  [id number]  PCP:  Cala Bradford, MD    Chief Complaint: Suture / Staple Removal   History of Present Illness:  This is a 30 y.o. female who is presenting for staple removal. She was here 2/28 with scalp laceration. #3 staples placed. She is not having any problems. No fever, chills, drainage.  Review of Systems:  Review of Systems See HPI  Patient Active Problem List   Diagnosis Date Noted  . UNSPECIFIED VITAMIN D DEFICIENCY 10/22/2008  . ASYMPTOMATIC VARICOSE VEINS 09/23/2008  . HYPOTHYROIDISM 08/06/2007  . ANXIETY STATE, UNSPECIFIED 08/06/2007  . TOURETTE'S DISORDER 08/06/2007  . ADD 08/06/2007  . HYPERTENSION 08/06/2007  . URTICARIA 08/06/2007  . ISCHEMIC COLITIS, HX OF 08/06/2007    Prior to Admission medications   Medication Sig Start Date End Date Taking? Authorizing Provider  clonazePAM (KLONOPIN) 1 MG tablet Take 1 mg by mouth 3 (three) times daily as needed for anxiety.   Yes Historical Provider, MD  levothyroxine (SYNTHROID, LEVOTHROID) 75 MCG tablet Take 50 mcg by mouth daily before breakfast.    Yes Historical Provider, MD  Multiple Vitamins-Minerals (MULTIVITAMIN PO) Take 1 tablet by mouth daily.   Yes Historical Provider, MD  pimozide (ORAP) 2 MG tablet Take 2 mg by mouth daily.   Yes Historical Provider, MD  topiramate (TOPAMAX) 25 MG tablet Take 50 mg by mouth daily.    Yes Historical Provider, MD  valsartan (DIOVAN) 320 MG tablet Take 320 mg by mouth daily.   Yes Historical Provider, MD    Allergies  Allergen Reactions  . Diphenhydramine Hcl     REACTION: interacts with orap  . Promethazine Hcl     REACTION: interacts with orap  . Pseudoephedrine     REACTION: colitis  . Sulfonamide Derivatives     REACTION: ? rash  . Zithromax [Azithromycin]     Interacts with orap that patient is taking     Past Surgical History  Procedure Laterality Date  . Cholecystectomy    . Cesarean section    . Wisdom tooth extraction      Social History  Substance Use Topics  . Smoking status: Never Smoker   . Smokeless tobacco: Never Used  . Alcohol Use: No    Family History  Problem Relation Age of Onset  . Heart disease Father   . Hypertension Father   . Heart attack Father   . Asthma Son   . ADD / ADHD Son   . Hypertension Paternal Grandmother   . Cancer Paternal Grandfather     Medication list has been reviewed and updated.  Physical Examination:  Physical Exam  Constitutional: She is oriented to person, place, and time. She appears well-developed and well-nourished. No distress.  HENT:  Head: Normocephalic and atraumatic.  Right Ear: Hearing normal.  Left Ear: Hearing normal.  Nose: Nose normal.  Eyes: Conjunctivae and lids are normal. Right eye exhibits no discharge. Left eye exhibits no discharge. No scleral icterus.  Pulmonary/Chest: Effort normal. No respiratory distress.  Musculoskeletal: Normal range of motion.  Neurological: She is alert and oriented to person, place, and time.  Skin: Skin is warm, dry and intact.  Right frontal scalp with healing laceration. #3 staples in place. No erythema or drainage.  Psychiatric: She has a normal mood and affect. Her speech  is normal and behavior is normal. Thought content normal.   BP 120/80 mmHg  Pulse 112  Temp(Src) 98.3 F (36.8 C) (Oral)  Resp 20  Ht 5\' 5"  (1.651 m)  Wt 201 lb (91.173 kg)  BMI 33.45 kg/m2  SpO2 98%  Assessment and Plan:  1. Scalp laceration, subsequent encounter Staples removed. Laceration healing well. Return as needed.   Roswell MinersNicole V. Dyke BrackettBush, PA-C, MHS [facility]  11/04/2015

## 2016-03-24 DIAGNOSIS — M722 Plantar fascial fibromatosis: Secondary | ICD-10-CM | POA: Diagnosis not present

## 2016-03-24 DIAGNOSIS — M79672 Pain in left foot: Secondary | ICD-10-CM | POA: Diagnosis not present

## 2016-03-24 DIAGNOSIS — M7661 Achilles tendinitis, right leg: Secondary | ICD-10-CM | POA: Diagnosis not present

## 2016-03-24 DIAGNOSIS — M79671 Pain in right foot: Secondary | ICD-10-CM | POA: Diagnosis not present

## 2016-03-31 DIAGNOSIS — J00 Acute nasopharyngitis [common cold]: Secondary | ICD-10-CM | POA: Diagnosis not present

## 2016-04-14 DIAGNOSIS — J209 Acute bronchitis, unspecified: Secondary | ICD-10-CM | POA: Diagnosis not present

## 2016-04-14 DIAGNOSIS — M722 Plantar fascial fibromatosis: Secondary | ICD-10-CM | POA: Diagnosis not present

## 2016-04-21 ENCOUNTER — Other Ambulatory Visit: Payer: Self-pay | Admitting: Family Medicine

## 2016-04-21 ENCOUNTER — Ambulatory Visit
Admission: RE | Admit: 2016-04-21 | Discharge: 2016-04-21 | Disposition: A | Discharge status: Home / Self Care | Payer: BLUE CROSS/BLUE SHIELD | Source: Ambulatory Visit | Attending: Family Medicine | Admitting: Family Medicine

## 2016-04-21 DIAGNOSIS — R059 Cough, unspecified: Secondary | ICD-10-CM

## 2016-04-21 DIAGNOSIS — R05 Cough: Secondary | ICD-10-CM | POA: Diagnosis not present

## 2016-04-23 DIAGNOSIS — R05 Cough: Secondary | ICD-10-CM | POA: Diagnosis not present

## 2016-04-25 DIAGNOSIS — F419 Anxiety disorder, unspecified: Secondary | ICD-10-CM | POA: Diagnosis not present

## 2016-05-12 ENCOUNTER — Institutional Professional Consult (permissible substitution): Payer: BLUE CROSS/BLUE SHIELD | Admitting: Pulmonary Disease

## 2016-05-12 DIAGNOSIS — J3089 Other allergic rhinitis: Secondary | ICD-10-CM | POA: Diagnosis not present

## 2016-05-12 DIAGNOSIS — R05 Cough: Secondary | ICD-10-CM | POA: Diagnosis not present

## 2016-05-23 DIAGNOSIS — N760 Acute vaginitis: Secondary | ICD-10-CM | POA: Diagnosis not present

## 2016-05-23 DIAGNOSIS — N898 Other specified noninflammatory disorders of vagina: Secondary | ICD-10-CM | POA: Diagnosis not present

## 2016-05-23 DIAGNOSIS — Z1389 Encounter for screening for other disorder: Secondary | ICD-10-CM | POA: Diagnosis not present

## 2016-05-23 DIAGNOSIS — Z130 Encounter for screening for diseases of the blood and blood-forming organs and certain disorders involving the immune mechanism: Secondary | ICD-10-CM | POA: Diagnosis not present

## 2016-05-23 DIAGNOSIS — Z01419 Encounter for gynecological examination (general) (routine) without abnormal findings: Secondary | ICD-10-CM | POA: Diagnosis not present

## 2016-05-23 DIAGNOSIS — F419 Anxiety disorder, unspecified: Secondary | ICD-10-CM | POA: Diagnosis not present

## 2016-05-23 DIAGNOSIS — Z6833 Body mass index (BMI) 33.0-33.9, adult: Secondary | ICD-10-CM | POA: Diagnosis not present

## 2016-05-23 DIAGNOSIS — N39498 Other specified urinary incontinence: Secondary | ICD-10-CM | POA: Diagnosis not present

## 2016-05-23 DIAGNOSIS — R8299 Other abnormal findings in urine: Secondary | ICD-10-CM | POA: Diagnosis not present

## 2016-06-02 DIAGNOSIS — F3177 Bipolar disorder, in partial remission, most recent episode mixed: Secondary | ICD-10-CM | POA: Diagnosis not present

## 2016-06-02 DIAGNOSIS — Z79899 Other long term (current) drug therapy: Secondary | ICD-10-CM | POA: Diagnosis not present

## 2016-06-29 DIAGNOSIS — F419 Anxiety disorder, unspecified: Secondary | ICD-10-CM | POA: Diagnosis not present

## 2016-08-03 DIAGNOSIS — R5382 Chronic fatigue, unspecified: Secondary | ICD-10-CM | POA: Diagnosis not present

## 2016-08-03 DIAGNOSIS — E559 Vitamin D deficiency, unspecified: Secondary | ICD-10-CM | POA: Diagnosis not present

## 2016-08-03 DIAGNOSIS — E162 Hypoglycemia, unspecified: Secondary | ICD-10-CM | POA: Diagnosis not present

## 2016-08-03 DIAGNOSIS — E039 Hypothyroidism, unspecified: Secondary | ICD-10-CM | POA: Diagnosis not present

## 2016-08-03 DIAGNOSIS — R000 Tachycardia, unspecified: Secondary | ICD-10-CM | POA: Diagnosis not present

## 2016-08-03 DIAGNOSIS — I10 Essential (primary) hypertension: Secondary | ICD-10-CM | POA: Diagnosis not present

## 2016-08-11 DIAGNOSIS — M25541 Pain in joints of right hand: Secondary | ICD-10-CM | POA: Diagnosis not present

## 2016-08-11 DIAGNOSIS — M549 Dorsalgia, unspecified: Secondary | ICD-10-CM | POA: Diagnosis not present

## 2016-08-16 DIAGNOSIS — R5383 Other fatigue: Secondary | ICD-10-CM | POA: Diagnosis not present

## 2016-08-16 DIAGNOSIS — Z8249 Family history of ischemic heart disease and other diseases of the circulatory system: Secondary | ICD-10-CM | POA: Diagnosis not present

## 2016-08-16 DIAGNOSIS — R000 Tachycardia, unspecified: Secondary | ICD-10-CM | POA: Diagnosis not present

## 2016-08-16 DIAGNOSIS — I10 Essential (primary) hypertension: Secondary | ICD-10-CM | POA: Diagnosis not present

## 2016-08-16 DIAGNOSIS — R0789 Other chest pain: Secondary | ICD-10-CM | POA: Diagnosis not present

## 2016-09-11 ENCOUNTER — Ambulatory Visit (HOSPITAL_COMMUNITY)
Admission: RE | Admit: 2016-09-11 | Discharge: 2016-09-11 | Disposition: A | Discharge status: Home / Self Care | Payer: BLUE CROSS/BLUE SHIELD | Source: Ambulatory Visit | Attending: Obstetrics and Gynecology | Admitting: Obstetrics and Gynecology

## 2016-09-11 ENCOUNTER — Other Ambulatory Visit (HOSPITAL_COMMUNITY): Payer: Self-pay | Admitting: Obstetrics and Gynecology

## 2016-09-11 DIAGNOSIS — R102 Pelvic and perineal pain: Secondary | ICD-10-CM | POA: Diagnosis not present

## 2016-09-11 DIAGNOSIS — R1032 Left lower quadrant pain: Secondary | ICD-10-CM | POA: Insufficient documentation

## 2016-09-11 DIAGNOSIS — N858 Other specified noninflammatory disorders of uterus: Secondary | ICD-10-CM | POA: Insufficient documentation

## 2016-09-11 DIAGNOSIS — Z113 Encounter for screening for infections with a predominantly sexual mode of transmission: Secondary | ICD-10-CM | POA: Diagnosis not present

## 2016-09-11 DIAGNOSIS — Z3202 Encounter for pregnancy test, result negative: Secondary | ICD-10-CM | POA: Diagnosis not present

## 2016-09-14 DIAGNOSIS — R300 Dysuria: Secondary | ICD-10-CM | POA: Diagnosis not present

## 2016-10-02 DIAGNOSIS — J111 Influenza due to unidentified influenza virus with other respiratory manifestations: Secondary | ICD-10-CM | POA: Diagnosis not present

## 2016-10-02 DIAGNOSIS — R6889 Other general symptoms and signs: Secondary | ICD-10-CM | POA: Diagnosis not present

## 2016-10-17 DIAGNOSIS — R05 Cough: Secondary | ICD-10-CM | POA: Diagnosis not present

## 2016-11-08 DIAGNOSIS — R05 Cough: Secondary | ICD-10-CM | POA: Diagnosis not present

## 2016-11-08 DIAGNOSIS — R509 Fever, unspecified: Secondary | ICD-10-CM | POA: Diagnosis not present

## 2016-11-10 DIAGNOSIS — R05 Cough: Secondary | ICD-10-CM | POA: Diagnosis not present

## 2017-01-15 DIAGNOSIS — E559 Vitamin D deficiency, unspecified: Secondary | ICD-10-CM | POA: Diagnosis not present

## 2017-01-15 DIAGNOSIS — E6609 Other obesity due to excess calories: Secondary | ICD-10-CM | POA: Diagnosis not present

## 2017-01-15 DIAGNOSIS — I10 Essential (primary) hypertension: Secondary | ICD-10-CM | POA: Diagnosis not present

## 2017-01-15 DIAGNOSIS — Z0000 Encounter for general adult medical examination without abnormal findings: Secondary | ICD-10-CM | POA: Diagnosis not present

## 2017-01-15 DIAGNOSIS — E039 Hypothyroidism, unspecified: Secondary | ICD-10-CM | POA: Diagnosis not present

## 2017-02-27 DIAGNOSIS — M79672 Pain in left foot: Secondary | ICD-10-CM | POA: Diagnosis not present

## 2017-02-27 DIAGNOSIS — M722 Plantar fascial fibromatosis: Secondary | ICD-10-CM | POA: Diagnosis not present

## 2017-02-27 DIAGNOSIS — M79671 Pain in right foot: Secondary | ICD-10-CM | POA: Diagnosis not present

## 2017-02-28 IMAGING — CR DG CHEST 2V
2 series · 2 of 2 positions shown · non-contrast
Comparison: PA and lateral chest x-ray June 20, 2015

CLINICAL DATA: Nonproductive cough for the past month following any
aide URI. Nonsmoker. History of hypertension.

EXAM:
CHEST  2 VIEW

[w chest pa]
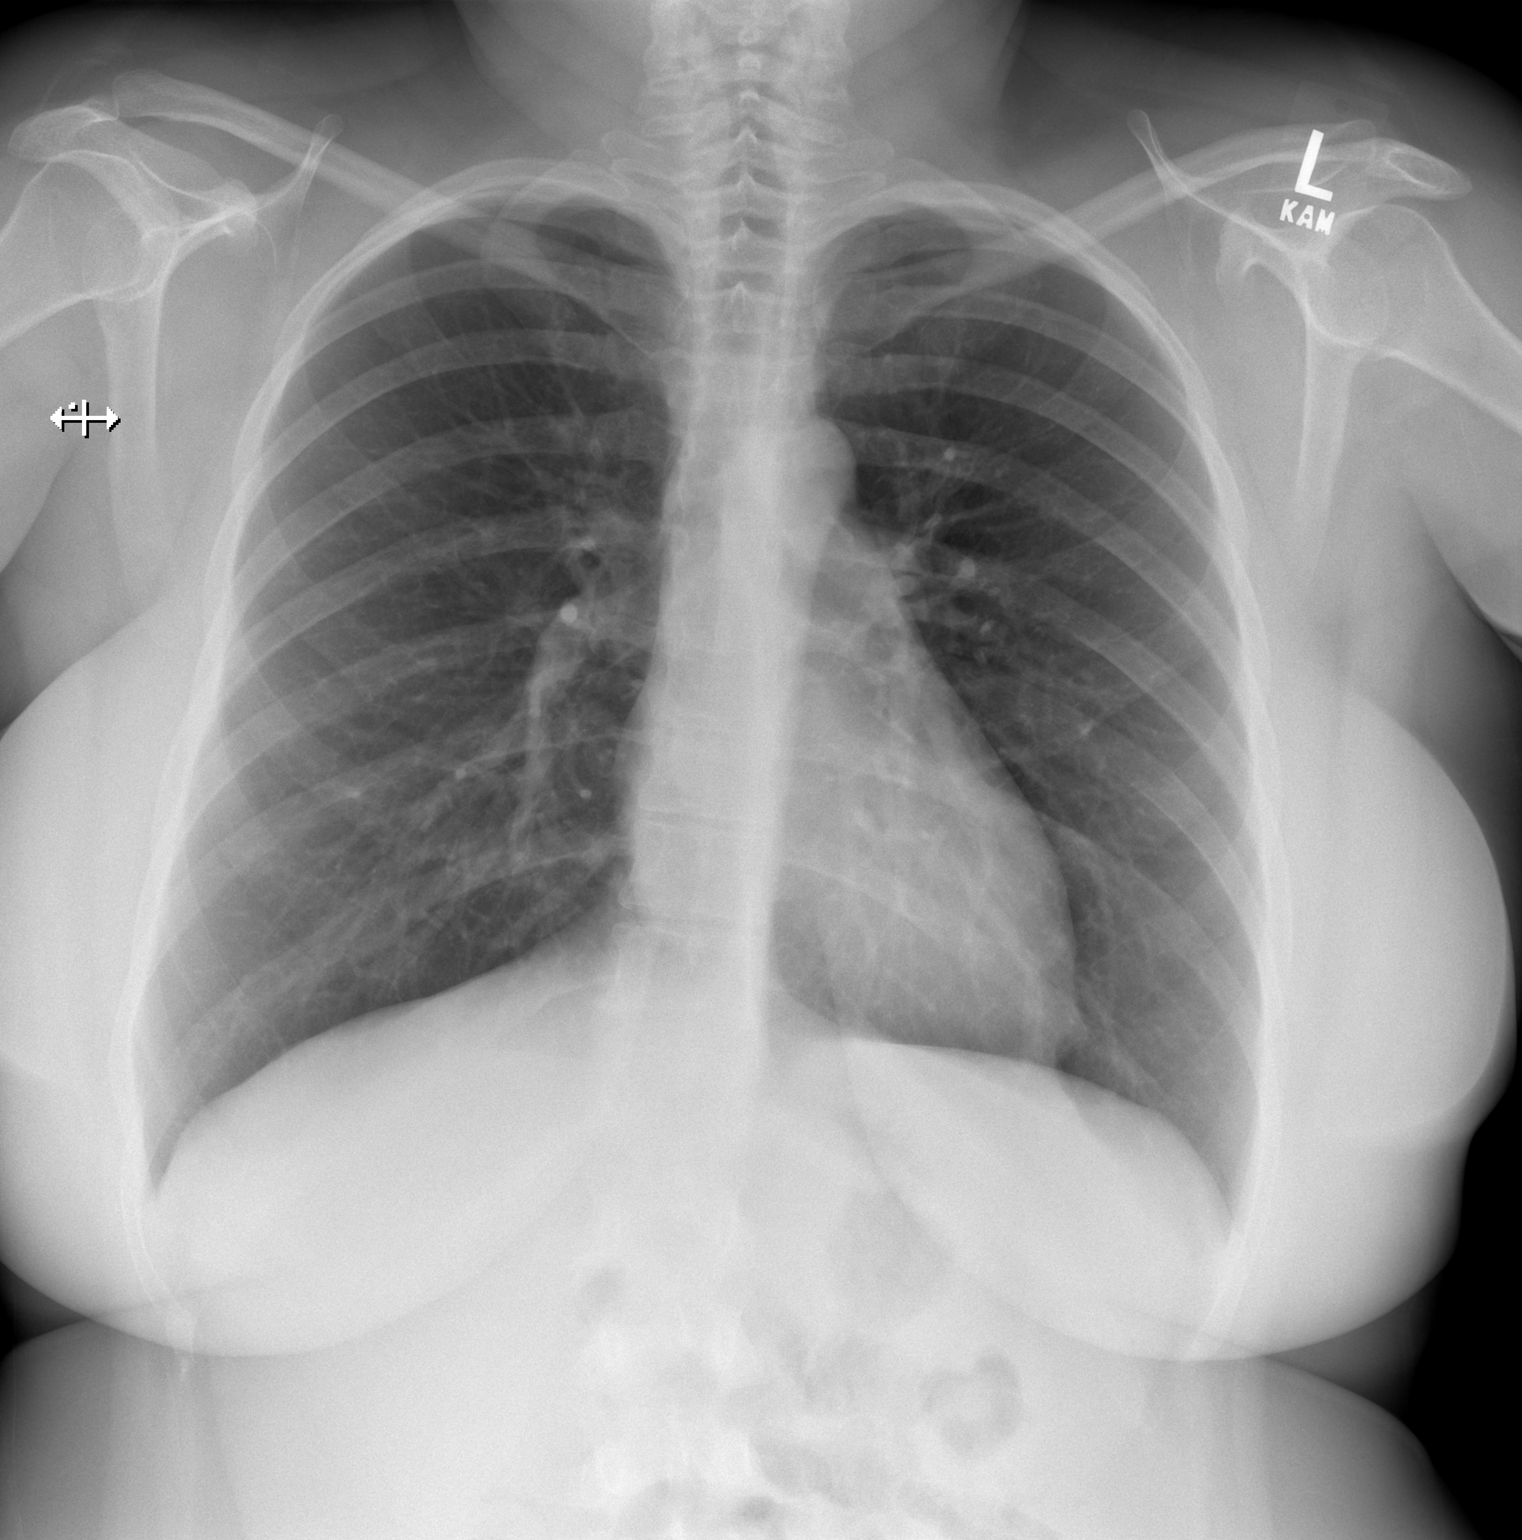

[w chest lat]
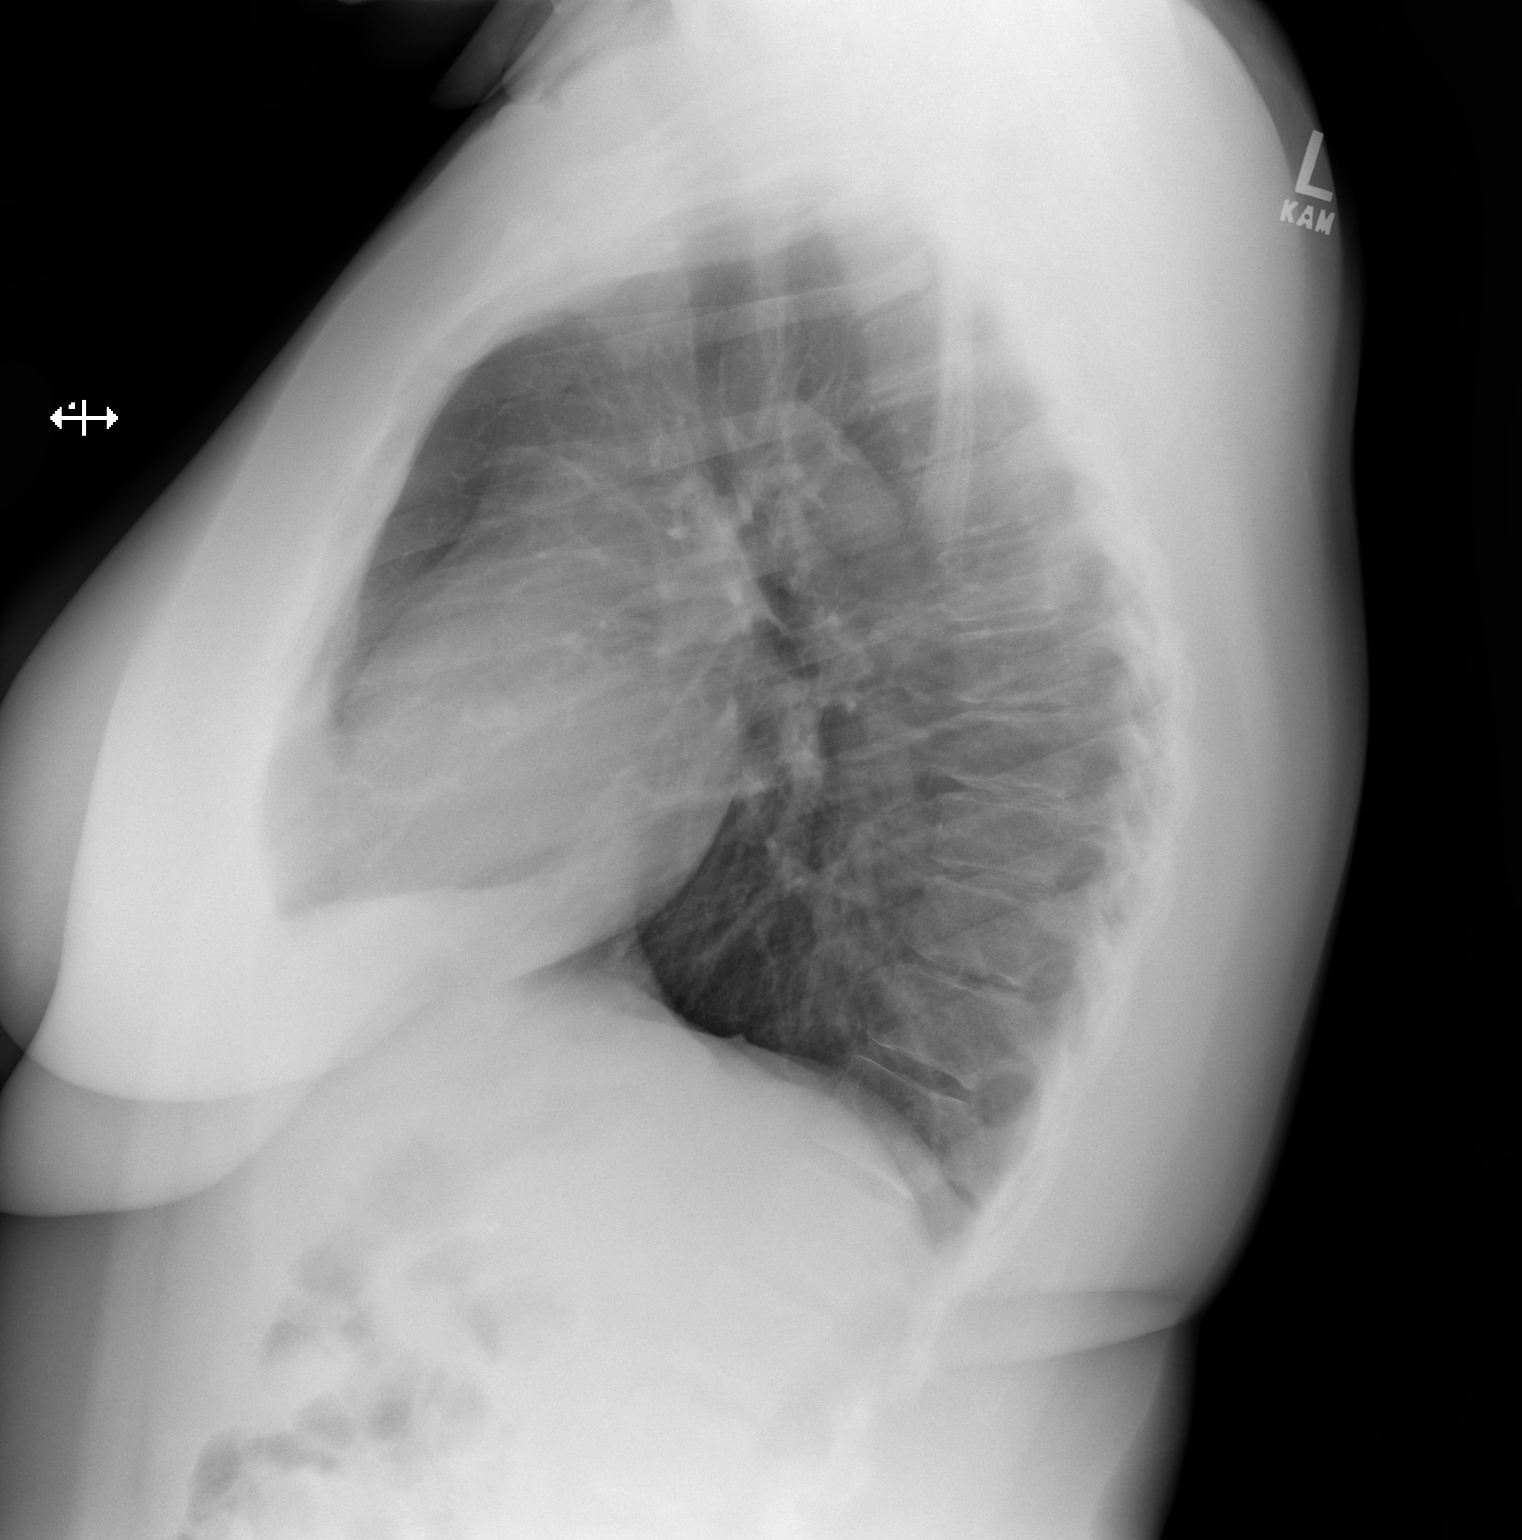

[2 of 2 positions shown; findings below may reference images not displayed]

FINDINGS: The lungs are mildly hyperinflated but clear. The heart and
pulmonary vascularity are normal. The mediastinum is normal in
width. There is no pleural effusion. The bony thorax is
unremarkable.
IMPRESSION: Mild hyperinflation may be voluntary or could reflect underlying
reactive airway disease. There is no acute cardiopulmonary
abnormality.

## 2017-07-04 DIAGNOSIS — F419 Anxiety disorder, unspecified: Secondary | ICD-10-CM | POA: Diagnosis not present

## 2017-07-12 DIAGNOSIS — M722 Plantar fascial fibromatosis: Secondary | ICD-10-CM | POA: Diagnosis not present

## 2017-07-12 DIAGNOSIS — M79672 Pain in left foot: Secondary | ICD-10-CM | POA: Diagnosis not present

## 2017-07-12 DIAGNOSIS — M7662 Achilles tendinitis, left leg: Secondary | ICD-10-CM | POA: Diagnosis not present

## 2017-07-12 DIAGNOSIS — M79671 Pain in right foot: Secondary | ICD-10-CM | POA: Diagnosis not present

## 2017-07-21 IMAGING — US US PELVIS COMPLETE
1 series · 15 of 25 positions shown · non-contrast
Comparison: CT on 04/02/2013

CLINICAL DATA: Left lower quadrant pain. Previous endometrial
ablation. Negative pregnancy test.

EXAM:
TRANSABDOMINAL AND TRANSVAGINAL ULTRASOUND OF PELVIS
TECHNIQUE: Both transabdominal and transvaginal ultrasound examinations of the
pelvis were performed. Transabdominal technique was performed for
global imaging of the pelvis including uterus, ovaries, adnexal
regions, and pelvic cul-de-sac. It was necessary to proceed with
endovaginal exam following the transabdominal exam to visualize the
endometrium and ovaries.

[Series 1: us pelvis complete · 15 of 55 slices shown]
[im 1/55]
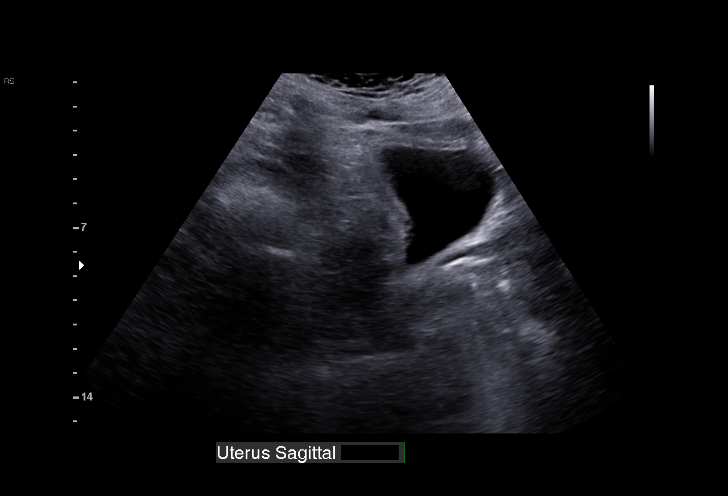
[im 5/55]
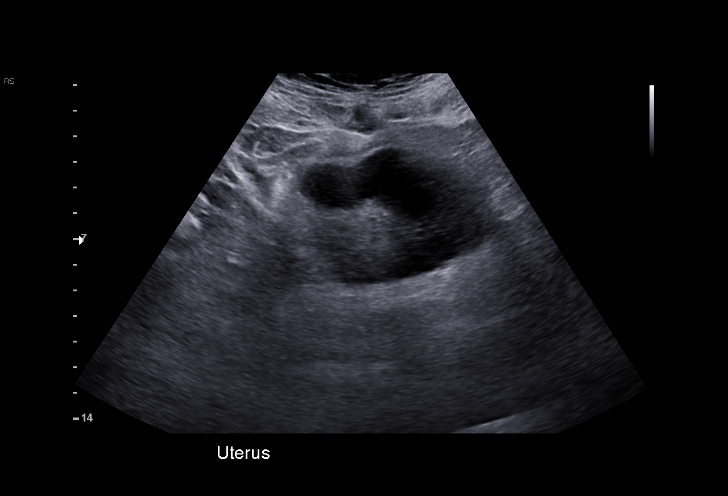
[im 10/55]
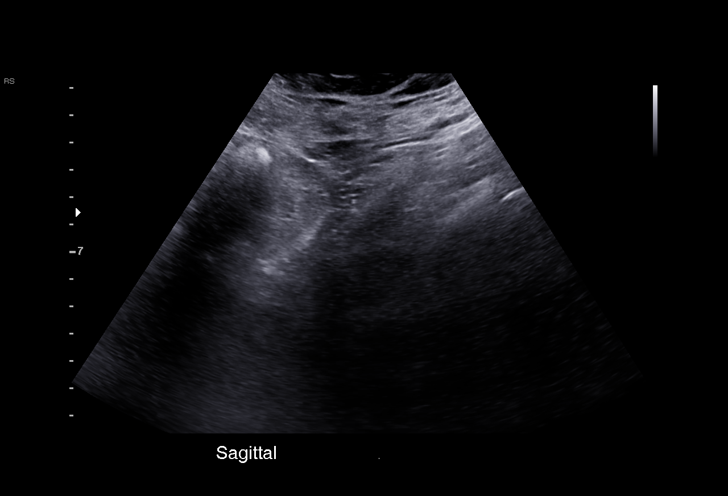
[im 12/55]
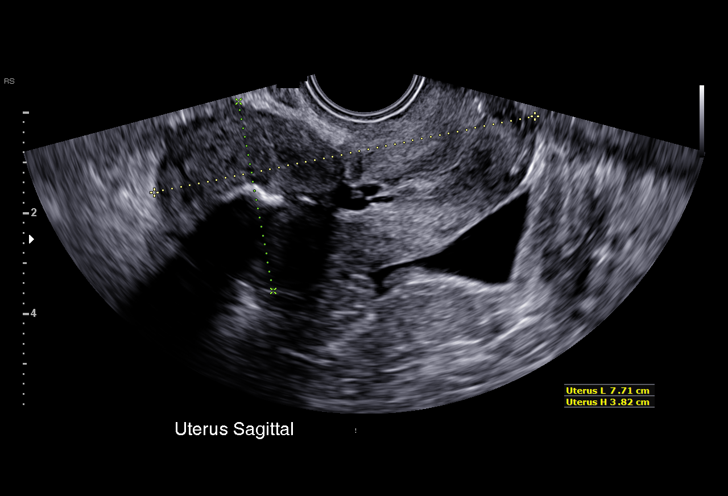
[im 16/55]
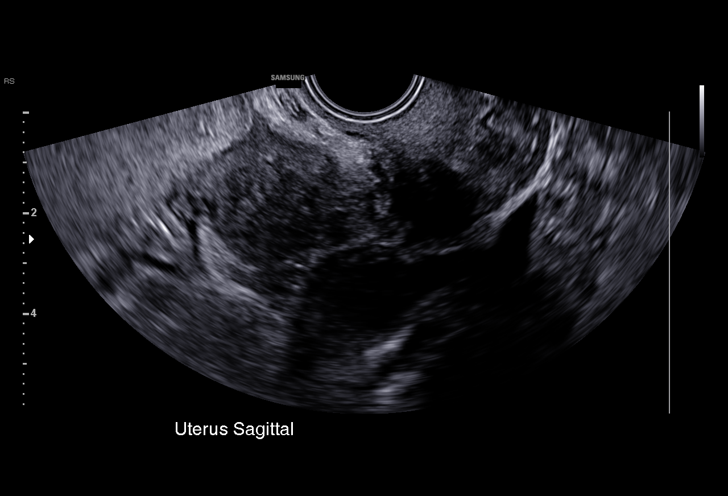
[im 21/55]
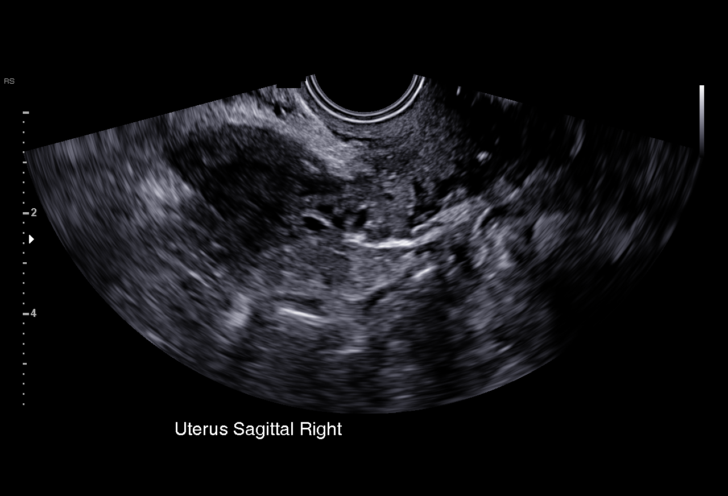
[im 23/55]
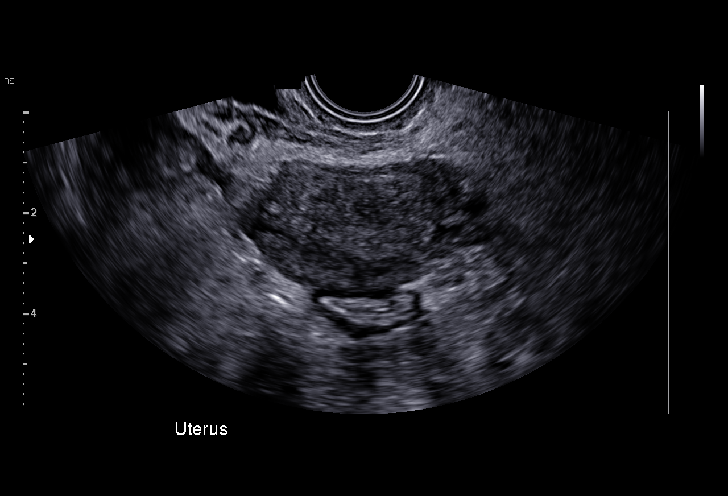
[im 28/55]
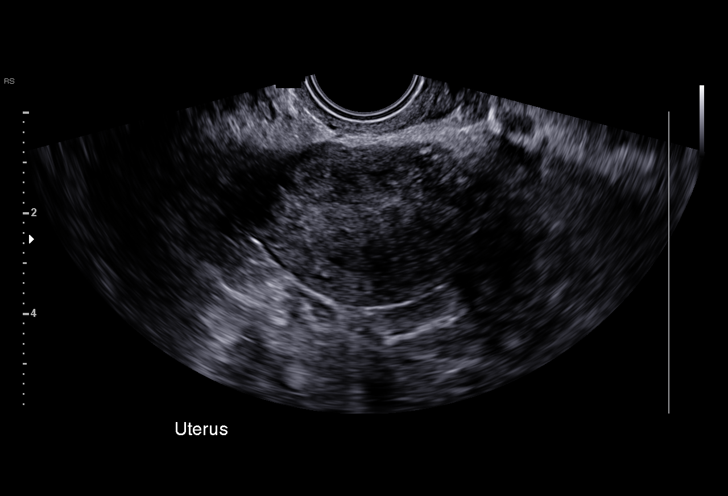
[im 32/55]
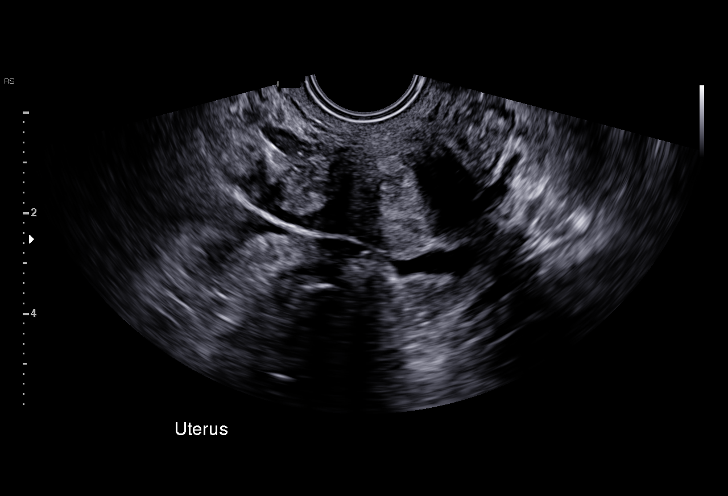
[im 34/55]
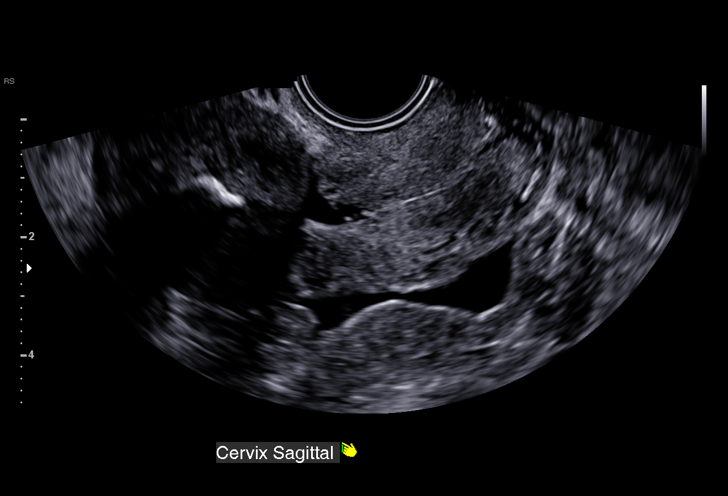
[im 39/55]
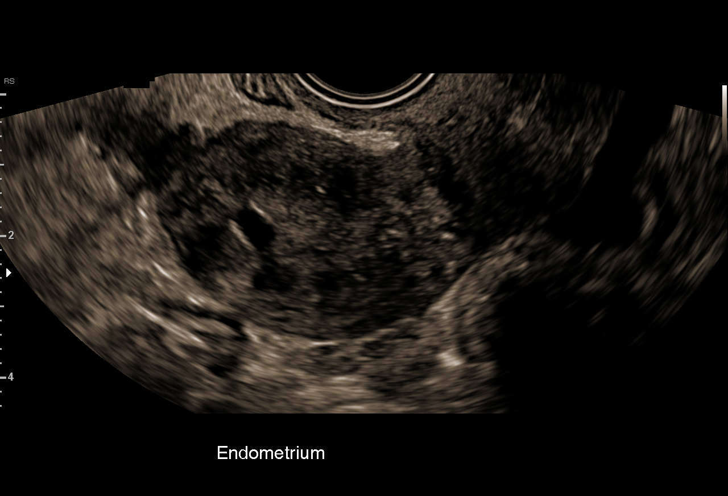
[im 43/55]
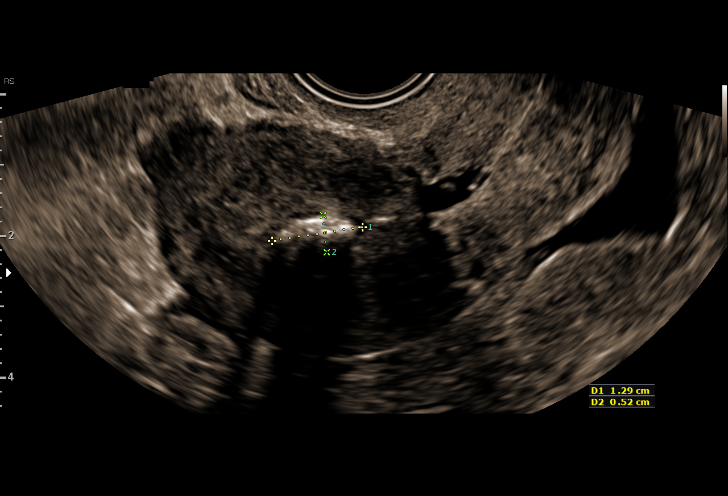
[im 46/55]
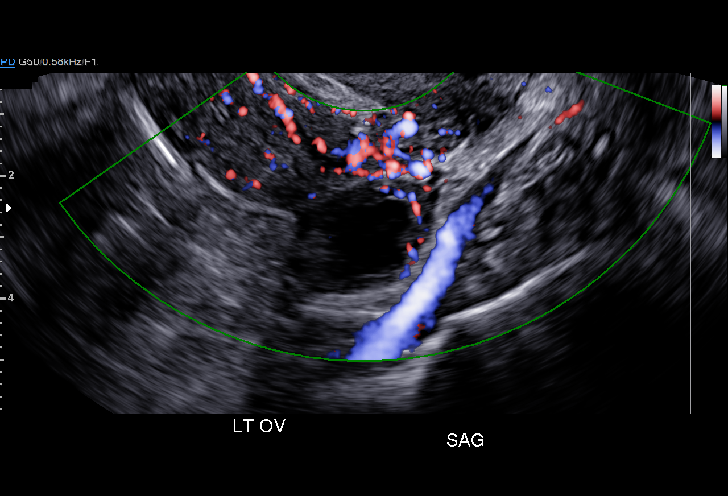
[im 50/55]
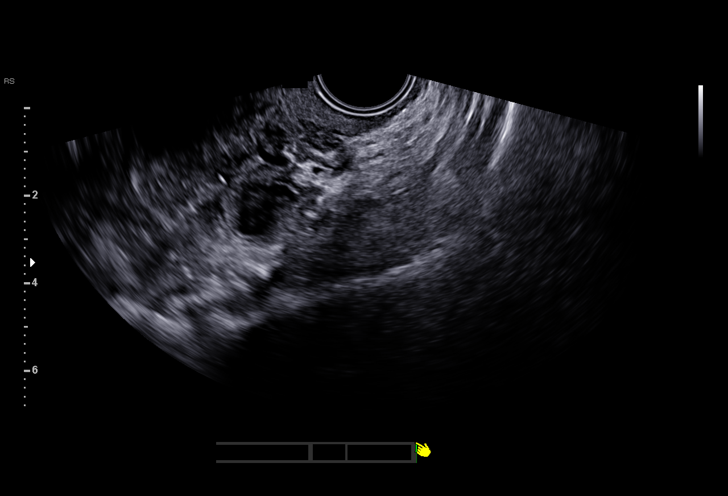
[im 55/55]
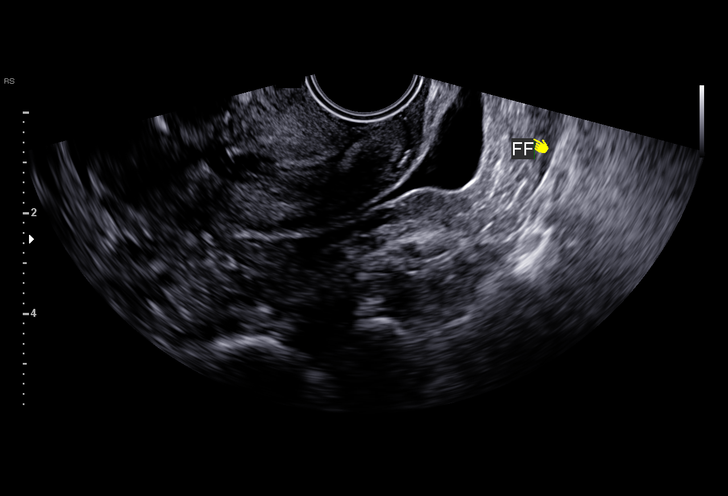

[15 of 25 positions shown; findings below may reference images not displayed]

FINDINGS: Uterus

Measurements: 7.7 x 3.8 x 4.6 cm. Diffusely heterogeneous
echogenicity of uterine myometrium noted, however no distinct
fibroids identified. Previous C-section scar incidentally noted.

Endometrium

Thickness: 11 mm. Shadowing calcifications seen within the
endometrial cavity. No evidence of hydrometros.

Right ovary

Measurements: 3.5 x 2.3 x 2.0 cm. Normal appearance/no adnexal mass.

Left ovary

Measurements: 3.3 x 1.8 x 2.3 cm.. Normal appearance/no adnexal
mass.

Other findings

Tiny amount of simple free fluid in cul-de-sac.
IMPRESSION: Endometrial thickness measures 11 mm, with several calcifications
seen within the endometrial cavity likely due to previous
endometrial ablation. No evidence of hydrometros.

Normal appearance of both ovaries.  No adnexal mass identified.

## 2017-10-03 DIAGNOSIS — D225 Melanocytic nevi of trunk: Secondary | ICD-10-CM | POA: Diagnosis not present

## 2017-10-03 DIAGNOSIS — L918 Other hypertrophic disorders of the skin: Secondary | ICD-10-CM | POA: Diagnosis not present

## 2017-10-03 DIAGNOSIS — D485 Neoplasm of uncertain behavior of skin: Secondary | ICD-10-CM | POA: Diagnosis not present

## 2017-10-03 DIAGNOSIS — D2262 Melanocytic nevi of left upper limb, including shoulder: Secondary | ICD-10-CM | POA: Diagnosis not present

## 2017-10-03 DIAGNOSIS — L821 Other seborrheic keratosis: Secondary | ICD-10-CM | POA: Diagnosis not present

## 2017-10-03 DIAGNOSIS — L905 Scar conditions and fibrosis of skin: Secondary | ICD-10-CM | POA: Diagnosis not present

## 2017-10-03 DIAGNOSIS — L820 Inflamed seborrheic keratosis: Secondary | ICD-10-CM | POA: Diagnosis not present

## 2017-10-09 DIAGNOSIS — Z130 Encounter for screening for diseases of the blood and blood-forming organs and certain disorders involving the immune mechanism: Secondary | ICD-10-CM | POA: Diagnosis not present

## 2017-10-09 DIAGNOSIS — Z1389 Encounter for screening for other disorder: Secondary | ICD-10-CM | POA: Diagnosis not present

## 2017-10-09 DIAGNOSIS — R208 Other disturbances of skin sensation: Secondary | ICD-10-CM | POA: Diagnosis not present

## 2017-10-09 DIAGNOSIS — E038 Other specified hypothyroidism: Secondary | ICD-10-CM | POA: Diagnosis not present

## 2017-10-09 DIAGNOSIS — Z6838 Body mass index (BMI) 38.0-38.9, adult: Secondary | ICD-10-CM | POA: Diagnosis not present

## 2017-10-09 DIAGNOSIS — Z01419 Encounter for gynecological examination (general) (routine) without abnormal findings: Secondary | ICD-10-CM | POA: Diagnosis not present

## 2017-10-16 DIAGNOSIS — L989 Disorder of the skin and subcutaneous tissue, unspecified: Secondary | ICD-10-CM | POA: Diagnosis not present

## 2017-10-16 DIAGNOSIS — J209 Acute bronchitis, unspecified: Secondary | ICD-10-CM | POA: Diagnosis not present

## 2017-10-24 DIAGNOSIS — L98499 Non-pressure chronic ulcer of skin of other sites with unspecified severity: Secondary | ICD-10-CM | POA: Diagnosis not present

## 2017-10-31 DIAGNOSIS — L98499 Non-pressure chronic ulcer of skin of other sites with unspecified severity: Secondary | ICD-10-CM | POA: Diagnosis not present

## 2018-01-01 DIAGNOSIS — R05 Cough: Secondary | ICD-10-CM | POA: Diagnosis not present

## 2018-01-01 DIAGNOSIS — J309 Allergic rhinitis, unspecified: Secondary | ICD-10-CM | POA: Diagnosis not present

## 2018-01-01 DIAGNOSIS — J069 Acute upper respiratory infection, unspecified: Secondary | ICD-10-CM | POA: Diagnosis not present

## 2018-01-01 DIAGNOSIS — K219 Gastro-esophageal reflux disease without esophagitis: Secondary | ICD-10-CM | POA: Diagnosis not present

## 2018-05-02 DIAGNOSIS — R69 Illness, unspecified: Secondary | ICD-10-CM | POA: Diagnosis not present

## 2018-06-25 DIAGNOSIS — Z0000 Encounter for general adult medical examination without abnormal findings: Secondary | ICD-10-CM | POA: Diagnosis not present

## 2018-06-25 DIAGNOSIS — G43909 Migraine, unspecified, not intractable, without status migrainosus: Secondary | ICD-10-CM | POA: Diagnosis not present

## 2018-06-25 DIAGNOSIS — R69 Illness, unspecified: Secondary | ICD-10-CM | POA: Diagnosis not present

## 2018-06-25 DIAGNOSIS — Z79899 Other long term (current) drug therapy: Secondary | ICD-10-CM | POA: Diagnosis not present

## 2018-06-25 DIAGNOSIS — E039 Hypothyroidism, unspecified: Secondary | ICD-10-CM | POA: Diagnosis not present

## 2018-06-25 DIAGNOSIS — I10 Essential (primary) hypertension: Secondary | ICD-10-CM | POA: Diagnosis not present

## 2018-06-25 DIAGNOSIS — R05 Cough: Secondary | ICD-10-CM | POA: Diagnosis not present

## 2018-06-25 DIAGNOSIS — E559 Vitamin D deficiency, unspecified: Secondary | ICD-10-CM | POA: Diagnosis not present

## 2018-08-07 DIAGNOSIS — L304 Erythema intertrigo: Secondary | ICD-10-CM | POA: Diagnosis not present

## 2018-08-07 DIAGNOSIS — L259 Unspecified contact dermatitis, unspecified cause: Secondary | ICD-10-CM | POA: Diagnosis not present

## 2018-09-03 DIAGNOSIS — Z6841 Body Mass Index (BMI) 40.0 and over, adult: Secondary | ICD-10-CM | POA: Diagnosis not present

## 2018-09-03 DIAGNOSIS — Z1389 Encounter for screening for other disorder: Secondary | ICD-10-CM | POA: Diagnosis not present

## 2018-09-03 DIAGNOSIS — R69 Illness, unspecified: Secondary | ICD-10-CM | POA: Diagnosis not present

## 2018-09-03 DIAGNOSIS — N760 Acute vaginitis: Secondary | ICD-10-CM | POA: Diagnosis not present

## 2018-09-03 DIAGNOSIS — Z130 Encounter for screening for diseases of the blood and blood-forming organs and certain disorders involving the immune mechanism: Secondary | ICD-10-CM | POA: Diagnosis not present

## 2018-09-03 DIAGNOSIS — Z01419 Encounter for gynecological examination (general) (routine) without abnormal findings: Secondary | ICD-10-CM | POA: Diagnosis not present

## 2018-09-03 DIAGNOSIS — N393 Stress incontinence (female) (male): Secondary | ICD-10-CM | POA: Diagnosis not present

## 2018-10-14 ENCOUNTER — Other Ambulatory Visit: Payer: Self-pay | Admitting: Psychiatry

## 2018-10-15 NOTE — Telephone Encounter (Signed)
Need to review chart not seen in epic 

## 2018-10-25 ENCOUNTER — Ambulatory Visit: Payer: 59 | Admitting: Psychiatry

## 2018-12-25 ENCOUNTER — Telehealth: Payer: Self-pay | Admitting: Psychiatry

## 2018-12-25 NOTE — Telephone Encounter (Signed)
Patient called and said that she has new insurance and the medicine orap 2mg  is not covered as a formulary drug. So send in the new script of orap 2 mg takes 2 tabs by mouth daily. She wants a 90 day supply sent in to get her to her appointment in July. She knows that it may be a process for the medicine so she wants to get the ball rolling. Please send script to [facility] [address] in [place]

## 2018-12-26 ENCOUNTER — Other Ambulatory Visit: Payer: Self-pay

## 2018-12-26 MED ORDER — PIMOZIDE 2 MG PO TABS: 4.0000 mg | ORAL_TABLET | Freq: Every day | ORAL | 1 refills | Status: DC

## 2018-12-26 NOTE — Telephone Encounter (Signed)
Submitted to pharmacy for 90 day

## 2018-12-30 ENCOUNTER — Telehealth: Payer: Self-pay

## 2018-12-30 NOTE — Telephone Encounter (Signed)
Per Proficient: Cardiologist/DR Harding-----(Pt only wants to see him and is willing to wait for hios 1st available),  Dyspnea on Exertion,  strong FHX of heart disease,  prev saw Dr Donnie Aho

## 2018-12-31 DIAGNOSIS — R5383 Other fatigue: Secondary | ICD-10-CM | POA: Diagnosis not present

## 2018-12-31 DIAGNOSIS — R61 Generalized hyperhidrosis: Secondary | ICD-10-CM | POA: Diagnosis not present

## 2018-12-31 DIAGNOSIS — R222 Localized swelling, mass and lump, trunk: Secondary | ICD-10-CM | POA: Diagnosis not present

## 2018-12-31 DIAGNOSIS — E039 Hypothyroidism, unspecified: Secondary | ICD-10-CM | POA: Diagnosis not present

## 2018-12-31 DIAGNOSIS — E559 Vitamin D deficiency, unspecified: Secondary | ICD-10-CM | POA: Diagnosis not present

## 2018-12-31 DIAGNOSIS — R079 Chest pain, unspecified: Secondary | ICD-10-CM | POA: Diagnosis not present

## 2019-01-01 ENCOUNTER — Telehealth: Payer: Self-pay | Admitting: Psychiatry

## 2019-01-01 ENCOUNTER — Other Ambulatory Visit: Payer: Self-pay | Admitting: Family Medicine

## 2019-01-01 DIAGNOSIS — R2230 Localized swelling, mass and lump, unspecified upper limb: Secondary | ICD-10-CM

## 2019-01-01 DIAGNOSIS — R222 Localized swelling, mass and lump, trunk: Secondary | ICD-10-CM

## 2019-01-01 NOTE — Telephone Encounter (Signed)
Yes per her request.  Her Tourette symptoms are very severe and she has failed multiple other medications and needs to continue this medication.  It is okay to call it in with 3 refills.  Thank you

## 2019-01-01 NOTE — Telephone Encounter (Signed)
Pt wants to see if her rx for Pimozide can be sent to a pharmacy in [place]. She can't afford it at [facility]. She said that you can call in 100 pills 4mg  each at [facility] in [place] [phone].

## 2019-01-02 ENCOUNTER — Other Ambulatory Visit: Payer: Self-pay

## 2019-01-02 MED ORDER — PIMOZIDE 2 MG PO TABS: 4.0000 mg | ORAL_TABLET | Freq: Every day | ORAL | 1 refills | Status: DC

## 2019-01-02 NOTE — Telephone Encounter (Signed)
Called pharmacy and they do not accept verbal orders, only faxes. I will print and have Dr. Jennelle Human sign then get faxed.

## 2019-01-02 NOTE — Telephone Encounter (Signed)
Requesting rx for pimozide be sent to pharmacy in [place], they do not take verbal orders, only faxes. Will send to [facility] f. [phone]

## 2019-01-03 ENCOUNTER — Telehealth: Payer: Self-pay | Admitting: Psychiatry

## 2019-01-03 NOTE — Telephone Encounter (Signed)
Pt. Informed that Rx was sent.

## 2019-01-03 NOTE — Telephone Encounter (Signed)
Patient stated she requested this refill on Wed. refill for Pimozide sent to [place] [facility] ph. [phone] fax#[phone]

## 2019-01-30 ENCOUNTER — Other Ambulatory Visit: Payer: Self-pay

## 2019-01-30 ENCOUNTER — Ambulatory Visit
Admission: RE | Admit: 2019-01-30 | Discharge: 2019-01-30 | Disposition: A | Discharge status: Home / Self Care | Payer: BLUE CROSS/BLUE SHIELD | Source: Ambulatory Visit | Attending: Family Medicine | Admitting: Family Medicine

## 2019-01-30 DIAGNOSIS — R2230 Localized swelling, mass and lump, unspecified upper limb: Secondary | ICD-10-CM

## 2019-01-30 DIAGNOSIS — R928 Other abnormal and inconclusive findings on diagnostic imaging of breast: Secondary | ICD-10-CM | POA: Diagnosis not present

## 2019-01-30 DIAGNOSIS — R222 Localized swelling, mass and lump, trunk: Secondary | ICD-10-CM

## 2019-01-30 DIAGNOSIS — N6489 Other specified disorders of breast: Secondary | ICD-10-CM | POA: Diagnosis not present

## 2019-03-11 DIAGNOSIS — E559 Vitamin D deficiency, unspecified: Secondary | ICD-10-CM | POA: Diagnosis not present

## 2019-03-11 DIAGNOSIS — E039 Hypothyroidism, unspecified: Secondary | ICD-10-CM | POA: Diagnosis not present

## 2019-03-24 ENCOUNTER — Encounter: Payer: Self-pay | Admitting: Psychiatry

## 2019-03-24 ENCOUNTER — Other Ambulatory Visit: Payer: Self-pay

## 2019-03-24 ENCOUNTER — Ambulatory Visit (INDEPENDENT_AMBULATORY_CARE_PROVIDER_SITE_OTHER): Payer: BC Managed Care – PPO | Admitting: Psychiatry

## 2019-03-24 DIAGNOSIS — F4001 Agoraphobia with panic disorder: Secondary | ICD-10-CM | POA: Diagnosis not present

## 2019-03-24 DIAGNOSIS — F902 Attention-deficit hyperactivity disorder, combined type: Secondary | ICD-10-CM

## 2019-03-24 DIAGNOSIS — F952 Tourette's disorder: Secondary | ICD-10-CM

## 2019-03-24 DIAGNOSIS — G43009 Migraine without aura, not intractable, without status migrainosus: Secondary | ICD-10-CM | POA: Diagnosis not present

## 2019-03-24 DIAGNOSIS — G43909 Migraine, unspecified, not intractable, without status migrainosus: Secondary | ICD-10-CM | POA: Insufficient documentation

## 2019-03-24 MED ORDER — PIMOZIDE 2 MG PO TABS: 4.0000 mg | ORAL_TABLET | Freq: Every day | ORAL | 1 refills | Status: DC

## 2019-03-24 NOTE — Progress Notes (Signed)
Rebecca OrmondChrissie C Hara [id number] Apr 08, 1986 33 y.o.  Subjective:   Patient ID:  Rebecca [name] is a 33 y.o. (DOB Apr 08, 1986) female.  Chief Complaint:  Chief Complaint  Patient presents with  . Follow-up    Medication management  . Tourette Syndrome    HPI Rebecca [name] presents to the office today for follow-up of tics and panic disorder.  Last seen in September.  Panic was bad and citalopram was added with increase in clonazepam to 1.5 mg HS.  She never took citalopram bc fear of heart issues.  She placed a phone call here after the visit and was reassured but still did not take the medicine.  Fortunately sx resolved about 3 weeks after the visit.  No panic now at all.  No clonazepam except rare.  Never during the day.  Patient reports stable mood and denies depressed or irritable moods.  Patient denies any recent difficulty with anxiety.  Patient denies difficulty with sleep initiation or maintenance. Denies appetite disturbance.  Patient reports that energy and motivation have been good.  Patient denies any difficulty with concentration.  Patient denies any suicidal ideation.  She tried to reduce Orap from 4 to 2 mg for a week but the Tourette's got worse and she increased it back to 4 mg.  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  Bosses not complaining at this time.  Manageable on the 4 mg.  Wasn't sure if decrease helped tiredness.  Has lost jobs DT tics.  At the same job since January.  Good so far.  [facility].  Enjoys geriatrics.    Past Psychiatric Medication Trials: Orap since high school, Depakote cog SE, Trileptal worsened tics, clonidine remote, clonazepam, sertraline, to[iramate, history of ADD meds  Review of Systems:  Review of Systems  Neurological: Positive for headaches. Negative for tremors and weakness.       Tics    Medications: I have reviewed the patient's current  medications.  Current Outpatient Medications  Medication Sig Dispense Refill  . clonazePAM (KLONOPIN) 1 MG tablet Take 1 mg by mouth 3 (three) times daily as needed for anxiety.    [name]. levothyroxine (SYNTHROID, LEVOTHROID) 75 MCG tablet Take 75 mcg by mouth daily before breakfast.     . metoprolol succinate (TOPROL-XL) 50 MG 24 hr tablet metoprolol succinate er 50 mg tb24    . Multiple Vitamins-Minerals (MULTIVITAMIN PO) Take 1 tablet by mouth daily.    [name]. omeprazole (PRILOSEC) 20 MG capsule omeprazole 20 mg cpdr    . pimozide (ORAP) 2 MG tablet Take 2 tablets (4 mg total) by mouth at bedtime. 100 tablet 1  . topiramate (TOPAMAX) 25 MG tablet Take 50 mg by mouth daily.     . valsartan (DIOVAN) 320 MG tablet Take 320 mg by mouth daily.     No current facility-administered medications for this visit.     Medication Side Effects: Fatigue and Other: tired  Allergies:  Allergies  Allergen Reactions  . Diphenhydramine Hcl     REACTION: interacts with orap  . Promethazine Hcl     REACTION: interacts with orap  . Pseudoephedrine     REACTION: colitis  . Sulfonamide Derivatives     REACTION: ? rash  . Zithromax [Azithromycin]     Interacts with orap that patient is taking    Past Medical History:  Diagnosis Date  . Hypertension   . Thyroid disease   .  Tourette's syndrome     Family History  Problem Relation Age of Onset  . Heart disease Father   . Hypertension Father   . Heart attack Father   . Asthma Son   . ADD / ADHD Son   . Hypertension Paternal Grandmother   . Cancer Paternal Grandfather     Social History   Socioeconomic History  . Marital status: Married    Spouse name: Not on file  . Number of children: Not on file  . Years of education: Not on file  . Highest education level: Not on file  Occupational History  . Not on file  Social Needs  . Financial resource strain: Not on file  . Food insecurity    Worry: Not on file    Inability: Not on file  .  Transportation needs    Medical: Not on file    Non-medical: Not on file  Tobacco Use  . Smoking status: Never Smoker  . Smokeless tobacco: Never Used  Substance and Sexual Activity  . Alcohol use: No  . Drug use: No  . Sexual activity: Yes    Birth control/protection: Other-see comments    Comment: husband vasectomy  Lifestyle  . Physical activity    Days per week: Not on file    Minutes per session: Not on file  . Stress: Not on file  Relationships  . Social Musicianconnections    Talks on phone: Not on file    Gets together: Not on file    Attends religious service: Not on file    Active member of club or organization: Not on file    Attends meetings of clubs or organizations: Not on file    Relationship status: Not on file  . Intimate partner violence    Fear of current or ex partner: Not on file    Emotionally abused: Not on file    Physically abused: Not on file    Forced sexual activity: Not on file  Other Topics Concern  . Not on file  Social History Narrative  . Not on file    Past Medical History, Surgical history, Social history, and Family history were reviewed and updated as appropriate.   Please see review of systems for further details on the patient's review from today.   Objective:   Physical Exam:  There were no vitals taken for this visit.  Physical Exam Constitutional:      General: She is not in acute distress.    Appearance: She is well-developed. She is obese.  Musculoskeletal:        General: No deformity.  Neurological:     Mental Status: She is alert and oriented to person, place, and time.     Coordination: Coordination normal.     Comments: Tics not seen in office today  Psychiatric:        Attention and Perception: Attention and perception normal. She does not perceive auditory or visual hallucinations.        Mood and Affect: Mood normal. Mood is not anxious or depressed. Affect is not labile, blunt, angry or inappropriate.         Speech: Speech normal.        Behavior: Behavior normal.        Thought Content: Thought content normal. Thought content is not paranoid or delusional. Thought content does not include homicidal or suicidal ideation. Thought content does not include homicidal or suicidal plan.        Cognition  and Memory: Cognition and memory normal.        Judgment: Judgment normal.     Comments: Insight fair.  General reservations about meds.     Lab Review:     Component Value Date/Time   NA 140 04/02/2013 0115   K 4.0 04/02/2013 0115   CL 106 04/02/2013 0115   CO2 22 04/02/2013 0115   GLUCOSE 86 04/02/2013 0115   BUN 12 04/02/2013 0115   CREATININE 0.65 04/02/2013 0115   CALCIUM 9.5 04/02/2013 0115   PROT 7.6 04/02/2013 0115   ALBUMIN 4.1 04/02/2013 0115   AST 21 04/02/2013 0115   ALT 13 04/02/2013 0115   ALKPHOS 67 04/02/2013 0115   BILITOT 0.2 (L) 04/02/2013 0115   GFRNONAA >90 04/02/2013 0115   GFRAA >90 04/02/2013 0115       Component Value Date/Time   WBC 11.1 (H) 04/02/2013 0115   RBC 4.34 04/02/2013 0115   HGB 13.0 04/02/2013 0115   HCT 37.4 04/02/2013 0115   PLT 227 04/02/2013 0115   MCV 86.2 04/02/2013 0115   MCH 30.0 04/02/2013 0115   MCHC 34.8 04/02/2013 0115   RDW 13.7 04/02/2013 0115   LYMPHSABS 2.7 04/02/2013 0115   MONOABS 0.7 04/02/2013 0115   EOSABS 0.1 04/02/2013 0115   BASOSABS 0.0 04/02/2013 0115    No results found for: POCLITH, LITHIUM   No results found for: PHENYTOIN, PHENOBARB, VALPROATE, CBMZ   .res Assessment: Plan:    Rebecca [name] was seen today for follow-up and tourette syndrome.  Diagnoses and all orders for this visit:  Tourette syndrome -     pimozide (ORAP) 2 MG tablet; Take 2 tablets (4 mg total) by mouth at bedtime.  Panic disorder with agoraphobia  Attention deficit hyperactivity disorder (ADHD), combined type  Migraine without aura and without status migrainosus, not intractable  Patient has a long history of Tourette syndrome,  ADD, and panic disorder.  She has lost a number of jobs over the years due to her Tourette's.  When the Tourette's is bad it causes lots of panic symptoms as well.  She has tried 6 mg of Orap in the past but does not like the side effects.  She is also generally reluctant to take higher dosages even though she still has significant symptoms.  She is also reluctant to try new medications.  For example she did not take the citalopram prescribed last visit.  She is somewhat fearful about medications and that does complicate her treatment somewhat.  However the last 6 months she is been relatively stable more so than usual, therefore it is not necessary to initiate a med change today.  Based on past history she is likely to have a relapse and exacerbation of Tourette's and panic again in the future.  At that time besides increasing Orap other considerations could be clonidine or increasing clonazepam.  Has to get Orap from [place] [facility] expense.    No med changes. Continue Orap 4 mg 1 tablet daily. Discussed potential metabolic side effects associated with atypical antipsychotics, as well as potential risk for movement side effects. Advised pt to contact office if movement side effects occur.   She uses clonazepam sparingly.  This is sometimes used as a secondary treatment for Tourette syndrome as well.  We discussed the short-term risks associated with benzodiazepines including sedation and increased fall risk among others.  Discussed long-term side effect risk including dependence, potential withdrawal symptoms, and the potential eventual dose-related risk of dementia.  23-minute  appointment  FU 6 mos  Lynder Parents, MD, DFAPA   Please see After Visit Summary for patient specific instructions.  No future appointments.  No orders of the defined types were placed in this encounter.   -------------------------------

## 2019-03-27 ENCOUNTER — Telehealth: Payer: Self-pay | Admitting: Psychiatry

## 2019-03-27 ENCOUNTER — Other Ambulatory Visit: Payer: Self-pay | Admitting: Psychiatry

## 2019-03-27 NOTE — Telephone Encounter (Signed)
Rx has been faxed.

## 2019-03-27 NOTE — Telephone Encounter (Signed)
Orap 4 mg does not show up in epic.  I will hand write a prescription.  She needs an quantities of 100 and will need to be faxed to the [facility]. It is [facility] in [place] at [phone]

## 2019-03-27 NOTE — Telephone Encounter (Signed)
Patient called and said that her medicine orap was sent wrong to [place]. It needs to be a 4 mg tablet not a 2 mg tablet because the 4 mg is cheaper . Please rewrite the prescription and fax it to [facility] in [place] at 1 [phone]. You can try to call it in but patient doesn't know if that is accepted. [phone]. Do not send electronically. Please make a note that this medicine needs to be a 4 mg tab aevery time. Patient says she has this problem every time and has told dr. Clovis Pu about it every time she sees him.

## 2019-04-03 ENCOUNTER — Telehealth: Payer: Self-pay | Admitting: Psychiatry

## 2019-04-03 NOTE — Telephone Encounter (Signed)
Pt requesting to be seen only once a year and enough medication to last until seen again. She states it's too expensive to be seen twice a year for a few minutes just to receive a prescription. Please call if this can be accommodated.

## 2019-04-04 ENCOUNTER — Telehealth: Payer: Self-pay

## 2019-04-04 NOTE — Telephone Encounter (Signed)
Left message for patient to call office back in reference to a referral that was sent by [facility].

## 2019-05-15 DIAGNOSIS — D2262 Melanocytic nevi of left upper limb, including shoulder: Secondary | ICD-10-CM | POA: Diagnosis not present

## 2019-05-15 DIAGNOSIS — L570 Actinic keratosis: Secondary | ICD-10-CM | POA: Diagnosis not present

## 2019-05-15 DIAGNOSIS — L814 Other melanin hyperpigmentation: Secondary | ICD-10-CM | POA: Diagnosis not present

## 2019-05-15 DIAGNOSIS — D225 Melanocytic nevi of trunk: Secondary | ICD-10-CM | POA: Diagnosis not present

## 2019-05-26 ENCOUNTER — Ambulatory Visit: Payer: BC Managed Care – PPO | Admitting: Cardiology

## 2019-05-27 ENCOUNTER — Ambulatory Visit: Payer: BC Managed Care – PPO | Admitting: Cardiology

## 2019-06-06 DIAGNOSIS — E039 Hypothyroidism, unspecified: Secondary | ICD-10-CM | POA: Diagnosis not present

## 2019-06-06 DIAGNOSIS — I10 Essential (primary) hypertension: Secondary | ICD-10-CM | POA: Diagnosis not present

## 2019-06-06 DIAGNOSIS — R5383 Other fatigue: Secondary | ICD-10-CM | POA: Diagnosis not present

## 2019-06-18 ENCOUNTER — Other Ambulatory Visit: Payer: Self-pay | Admitting: *Deleted

## 2019-06-18 DIAGNOSIS — Z20822 Contact with and (suspected) exposure to covid-19: Secondary | ICD-10-CM

## 2019-06-19 LAB — NOVEL CORONAVIRUS, NAA: SARS-CoV-2, NAA: NOT DETECTED

## 2019-07-07 DIAGNOSIS — Z79899 Other long term (current) drug therapy: Secondary | ICD-10-CM | POA: Diagnosis not present

## 2019-07-31 ENCOUNTER — Encounter: Payer: Self-pay | Admitting: Psychiatry

## 2019-07-31 ENCOUNTER — Ambulatory Visit (INDEPENDENT_AMBULATORY_CARE_PROVIDER_SITE_OTHER): Payer: BC Managed Care – PPO | Admitting: Psychiatry

## 2019-07-31 VITALS — BP 128/92 | Wt 255.0 lb

## 2019-07-31 DIAGNOSIS — F902 Attention-deficit hyperactivity disorder, combined type: Secondary | ICD-10-CM | POA: Diagnosis not present

## 2019-07-31 DIAGNOSIS — F952 Tourette's disorder: Secondary | ICD-10-CM | POA: Diagnosis not present

## 2019-07-31 DIAGNOSIS — F4001 Agoraphobia with panic disorder: Secondary | ICD-10-CM

## 2019-07-31 MED ORDER — PHENTERMINE HCL 37.5 MG PO TABS: ORAL_TABLET | ORAL | 1 refills | Status: DC

## 2019-07-31 NOTE — Progress Notes (Signed)
Rebecca [name] [id number] 1986-02-19 33 y.o.  Subjective:   Patient ID:  Rebecca [name] is a 33 y.o. (DOB 05-13-1986) female.  Chief Complaint:  Chief Complaint  Patient presents with  . Follow-up    Medication Management  . Medication Refill    Orap -send Rx to [facility]- Fax # [phone]    Medication Refill Associated symptoms include headaches. Pertinent negatives include no weakness.   Rebecca [name] presents to the office today for follow-up of tics and panic disorder.  Last seen in September.  Panic was bad and citalopram was added with increase in clonazepam to 1.5 mg HS.  She never took citalopram bc fear of heart issues.  She placed a phone call here after the visit and was reassured but still did not take the medicine.  Last seen In July 27.    The same, fine with tics.  It doesn't change re: tics.  I learn to live with it.  No complaints lately with boss at work about tics.  Tics will worsen if nervous or upset or around crowds.  Here and there has anxiety.  Long drive to work and worked up when she gets home from work and deal with kids at night.  Poor sleep.  Restless sleep and up 1/2 the night.    Not really taking clonazepam 1 mg much bc inconsistent effects on her sleep.  Tries not to drink in evening.  Tries to keep it in the morning.  Avoids after lunch.  Eats dinner early.  Both initial and terminal insomnia and averages ? Amount of sleep.  Tired in the day but stays awake.    BP is up some DT weight.    Fortunately sx resolved about 3 weeks after the visit.  No panic now at all.  No clonazepam except rare.  Never during the day.  Patient reports stable mood and denies depressed or irritable moods.  Patient denies any recent difficulty with anxiety.  Sleep issues and has talked to PCP about probable sleep apnea. Denies appetite disturbance.  Patient reports that energy and motivation have been good.  Patient denies any difficulty with  concentration.  Patient denies any suicidal ideation.  She tried to reduce Orap from 4 to 2 mg for a week but the Tourette's got worse and she increased it back to 4 mg.  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  Bosses not complaining at this time.  Manageable on the 4 mg.  Wasn't sure if decrease helped tiredness.  Has lost jobs DT tics.  At the same job since January.  Good so far.  At [facility] clinic.  Enjoys geriatrics.    Past Psychiatric Medication Trials: Orap since high school, Depakote cog SE, Trileptal worsened tics, clonidine remote,  sertraline, topiramate,  history of ADD meds clonazepam 1,  Review of Systems:  Review of Systems  Neurological: Positive for headaches. Negative for tremors and weakness.       Tics    Medications: I have reviewed the patient's current medications.  Current Outpatient Medications  Medication Sig Dispense Refill  . clonazePAM (KLONOPIN) 1 MG tablet Take 1 mg by mouth 3 (three) times daily as needed for anxiety.    [name] levothyroxine (SYNTHROID, LEVOTHROID) 75 MCG tablet Take 75 mcg by mouth daily before breakfast.     . metoprolol succinate (TOPROL-XL) 50 MG 24 hr tablet Take 100 mg by mouth  daily.     . Multiple Vitamins-Minerals (MULTIVITAMIN PO) Take 1 tablet by mouth daily.    [name]. omeprazole (PRILOSEC) 20 MG capsule omeprazole 20 mg cpdr    . pimozide (ORAP) 2 MG tablet Take 4 mg by mouth daily.     [name]. topiramate (TOPAMAX) 25 MG tablet Take 100 mg by mouth daily.     . valsartan (DIOVAN) 320 MG tablet Take 320 mg by mouth daily.    . phentermine (ADIPEX-P) 37.5 MG tablet 1/2 tablet in the morning for 4 days , then 1 daily 30 tablet 1   No current facility-administered medications for this visit.     Medication Side Effects: Fatigue and Other: tired  Allergies:  Allergies  Allergen Reactions  . Diphenhydramine Hcl     REACTION: interacts with orap  . Promethazine  Hcl     REACTION: interacts with orap  . Pseudoephedrine     REACTION: colitis  . Sulfonamide Derivatives     REACTION: ? rash  . Zithromax [Azithromycin]     Interacts with orap that patient is taking    Past Medical History:  Diagnosis Date  . Hypertension   . Thyroid disease   . Tourette's syndrome     Family History  Problem Relation Age of Onset  . Heart disease Father   . Hypertension Father   . Heart attack Father   . Asthma Son   . ADD / ADHD Son   . Hypertension Paternal Grandmother   . Cancer Paternal Grandfather     Social History   Socioeconomic History  . Marital status: Married    Spouse name: Not on file  . Number of children: Not on file  . Years of education: Not on file  . Highest education level: Not on file  Occupational History  . Not on file  Social Needs  . Financial resource strain: Not on file  . Food insecurity    Worry: Not on file    Inability: Not on file  . Transportation needs    Medical: Not on file    Non-medical: Not on file  Tobacco Use  . Smoking status: Never Smoker  . Smokeless tobacco: Never Used  Substance and Sexual Activity  . Alcohol use: No  . Drug use: No  . Sexual activity: Yes    Birth control/protection: Other-see comments    Comment: husband vasectomy  Lifestyle  . Physical activity    Days per week: Not on file    Minutes per session: Not on file  . Stress: Not on file  Relationships  . Social Musicianconnections    Talks on phone: Not on file    Gets together: Not on file    Attends religious service: Not on file    Active member of club or organization: Not on file    Attends meetings of clubs or organizations: Not on file    Relationship status: Not on file  . Intimate partner violence    Fear of current or ex partner: Not on file    Emotionally abused: Not on file    Physically abused: Not on file    Forced sexual activity: Not on file  Other Topics Concern  . Not on file  Social History Narrative   . Not on file    Past Medical History, Surgical history, Social history, and Family history were reviewed and updated as appropriate.   Please see review of systems for further details on the patient's review from  today.   Objective:   Physical Exam:  BP (!) 128/92   Wt 255 lb (115.7 kg)   BMI 42.43 kg/m   Physical Exam Constitutional:      General: She is not in acute distress.    Appearance: She is well-developed. She is obese.  Musculoskeletal:        General: No deformity.  Neurological:     Mental Status: She is alert and oriented to person, place, and time.     Coordination: Coordination normal.     Comments: Tics not seen in office today  Psychiatric:        Attention and Perception: Attention and perception normal. She does not perceive auditory or visual hallucinations.        Mood and Affect: Mood normal. Mood is not anxious or depressed. Affect is not labile, blunt, angry or inappropriate.        Speech: Speech normal.        Behavior: Behavior normal.        Thought Content: Thought content normal. Thought content is not paranoid or delusional. Thought content does not include homicidal or suicidal ideation. Thought content does not include homicidal or suicidal plan.        Cognition and Memory: Cognition and memory normal.        Judgment: Judgment normal.     Comments: Insight fair.  General reservations about meds.     Lab Review:     Component Value Date/Time   NA 140 04/02/2013 0115   K 4.0 04/02/2013 0115   CL 106 04/02/2013 0115   CO2 22 04/02/2013 0115   GLUCOSE 86 04/02/2013 0115   BUN 12 04/02/2013 0115   CREATININE 0.65 04/02/2013 0115   CALCIUM 9.5 04/02/2013 0115   PROT 7.6 04/02/2013 0115   ALBUMIN 4.1 04/02/2013 0115   AST 21 04/02/2013 0115   ALT 13 04/02/2013 0115   ALKPHOS 67 04/02/2013 0115   BILITOT 0.2 (L) 04/02/2013 0115   GFRNONAA >90 04/02/2013 0115   GFRAA >90 04/02/2013 0115       Component Value Date/Time   WBC 11.1  (H) 04/02/2013 0115   RBC 4.34 04/02/2013 0115   HGB 13.0 04/02/2013 0115   HCT 37.4 04/02/2013 0115   PLT 227 04/02/2013 0115   MCV 86.2 04/02/2013 0115   MCH 30.0 04/02/2013 0115   MCHC 34.8 04/02/2013 0115   RDW 13.7 04/02/2013 0115   LYMPHSABS 2.7 04/02/2013 0115   MONOABS 0.7 04/02/2013 0115   EOSABS 0.1 04/02/2013 0115   BASOSABS 0.0 04/02/2013 0115    No results found for: POCLITH, LITHIUM   No results found for: PHENYTOIN, PHENOBARB, VALPROATE, CBMZ   .res Assessment: Plan:    Yashica was seen today for follow-up and medication refill.  Diagnoses and all orders for this visit:  Tourette syndrome  Panic disorder with agoraphobia  Attention deficit hyperactivity disorder (ADHD), combined type  Other orders -     phentermine (ADIPEX-P) 37.5 MG tablet; 1/2 tablet in the morning for 4 days , then 1 daily  Obesity Likely obstructive sleep apnea  Patient has a long history of Tourette syndrome, ADD, and panic disorder.  She has lost a number of jobs over the years due to her Tourette's.  When the Tourette's is bad it causes lots of panic symptoms as well.  She has tried 6 mg of Orap in the past but does not like the side effects.  She is also generally reluctant to take  higher dosages even though she still has significant symptoms.  She is also reluctant to try new medications.   She is somewhat fearful about medications and that does complicate her treatment somewhat.  However the last 9-12 months she is been relatively stable more so than usual, therefore it is not necessary to initiate a med change today.  Based on past history she is likely to have a relapse and exacerbation of Tourette's and panic again in the future.  At that time besides increasing Orap other considerations could be clonidine or increasing clonazepam.  I am hesitant to push her to start clonazepam at this time because of the probable sleep apnea discussed below.  Benzodiazepines can worsen sleep  apnea.  Extensive discussion about probable OSA and strongly recommend sleep study which PCP also recommended.  She's pretty sure she has sleep apnea.  Discussed cardiovascular risk as well as risk for daytime driving because of drowsiness and she drives 45 minutes to an hour each way to work.  She is anxious and does not feel like she can tolerate CPAP.  Discussed moderate advances with CPAP machines including the use of nasal pillows and automatic machines which make it much more tolerable.  She refuses for now but is interested in pursuing weight loss as a way to solve the problem.  Discussed weight loss meds and how some stimulants can cause worsening of tics.  She would like to pursue a trial of phentermine.  If it causes take she will stop it.  Discussed the risk of the side effects including cardiovascular risks.  She will contact us if she has any significant side effects. Discussed potential benefits, risks, and side effects of stimulants with patient to include increased heart rate, palpitations, insomnia, increased anxiety, increased irritability, or decreased appetite.  Instructed patient to contact office if experiencing any significant tolerability issues.  Disc phentermine and weight loss.  "I'm desperate to lose weight."   Okay to start phentermine 37.5 mg tablets one half every morning for 7 days then 1 every morning.  #30 and 1 refill sent in.  Continue Orap 4 mg 1 tablet daily. Has to get Orap from [place] DT expense.  She is prescribed Orap 4 mg tablets 1 daily #100 with 2 refills.  Orap 4 mg tablet size is not an option in epic.  Prescription had to be handwritten.  Discussed potential metabolic side effects associated with atypical antipsychotics, as well as potential risk for movement side effects. Advised pt to contact office if movement side effects occur.   She uses clonazepam sparingly.  This is sometimes used as a secondary treatment for Tourette syndrome as well.  We discussed  the short-term risks associated with benzodiazepines including sedation and increased fall risk among others.  Discussed long-term side effect risk including dependence, potential withdrawal symptoms, and the potential eventual dose-related risk of dementia.  Meredith Staggers, MD, DFAPA   Please see After Visit Summary for patient specific instructions.  No future appointments.  No orders of the defined types were placed in this encounter.   -------------------------------

## 2019-08-20 ENCOUNTER — Telehealth: Payer: Self-pay | Admitting: Psychiatry

## 2019-08-20 NOTE — Telephone Encounter (Signed)
Rebecca [name] called because she takes a medicine called Orap.  She reports that this medication interacts with a lot of things.  She wants to know if you think it would be safe for her to get the Covid-19 vaccine while taking the Orap.  Please call to her know your opinion.

## 2019-08-20 NOTE — Telephone Encounter (Signed)
Left pt a detailed message and asked to call back with any questions.

## 2019-08-20 NOTE — Telephone Encounter (Signed)
Absolutely no problem with Covid vaccine and Orap.  Vaccines do not interact with medications in the same was as standard medications may interact with Orap

## 2019-10-10 DIAGNOSIS — R5383 Other fatigue: Secondary | ICD-10-CM | POA: Diagnosis not present

## 2019-10-10 DIAGNOSIS — Z0000 Encounter for general adult medical examination without abnormal findings: Secondary | ICD-10-CM | POA: Diagnosis not present

## 2019-10-10 DIAGNOSIS — I10 Essential (primary) hypertension: Secondary | ICD-10-CM | POA: Diagnosis not present

## 2019-10-10 DIAGNOSIS — E559 Vitamin D deficiency, unspecified: Secondary | ICD-10-CM | POA: Diagnosis not present

## 2019-10-10 DIAGNOSIS — E039 Hypothyroidism, unspecified: Secondary | ICD-10-CM | POA: Diagnosis not present

## 2019-10-29 DIAGNOSIS — Z124 Encounter for screening for malignant neoplasm of cervix: Secondary | ICD-10-CM | POA: Diagnosis not present

## 2019-10-29 DIAGNOSIS — Z1151 Encounter for screening for human papillomavirus (HPV): Secondary | ICD-10-CM | POA: Diagnosis not present

## 2019-10-29 DIAGNOSIS — Z01419 Encounter for gynecological examination (general) (routine) without abnormal findings: Secondary | ICD-10-CM | POA: Diagnosis not present

## 2019-10-29 DIAGNOSIS — Z130 Encounter for screening for diseases of the blood and blood-forming organs and certain disorders involving the immune mechanism: Secondary | ICD-10-CM | POA: Diagnosis not present

## 2019-12-09 IMAGING — MG DIGITAL DIAGNOSTIC BILATERAL MAMMOGRAM WITH TOMO AND CAD
6 of 12 series · 6 of 36 positions shown · non-contrast
Comparison: None.

CLINICAL DATA: 33-year-old female with a palpable right axillary
lump for approximately 6 months.

EXAM:
DIGITAL DIAGNOSTIC BILATERAL MAMMOGRAM WITH CAD AND TOMO
ULTRASOUND RIGHT BREAST

[R TAN synth-2D]
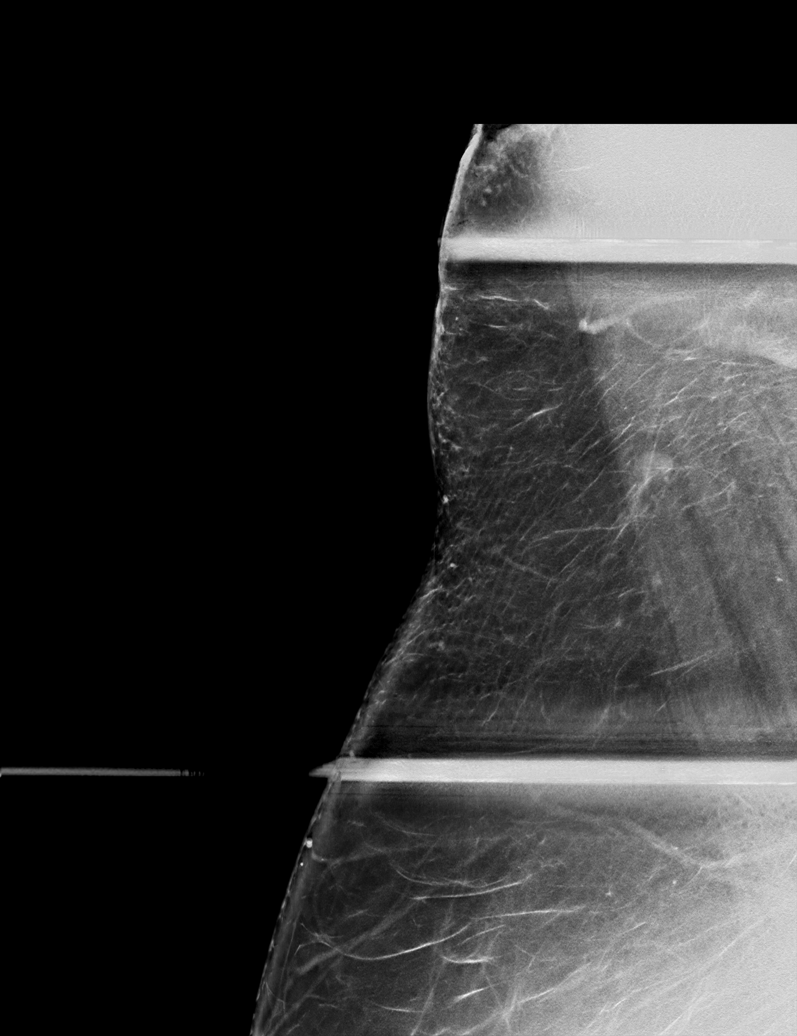

[L MLO synth-2D]
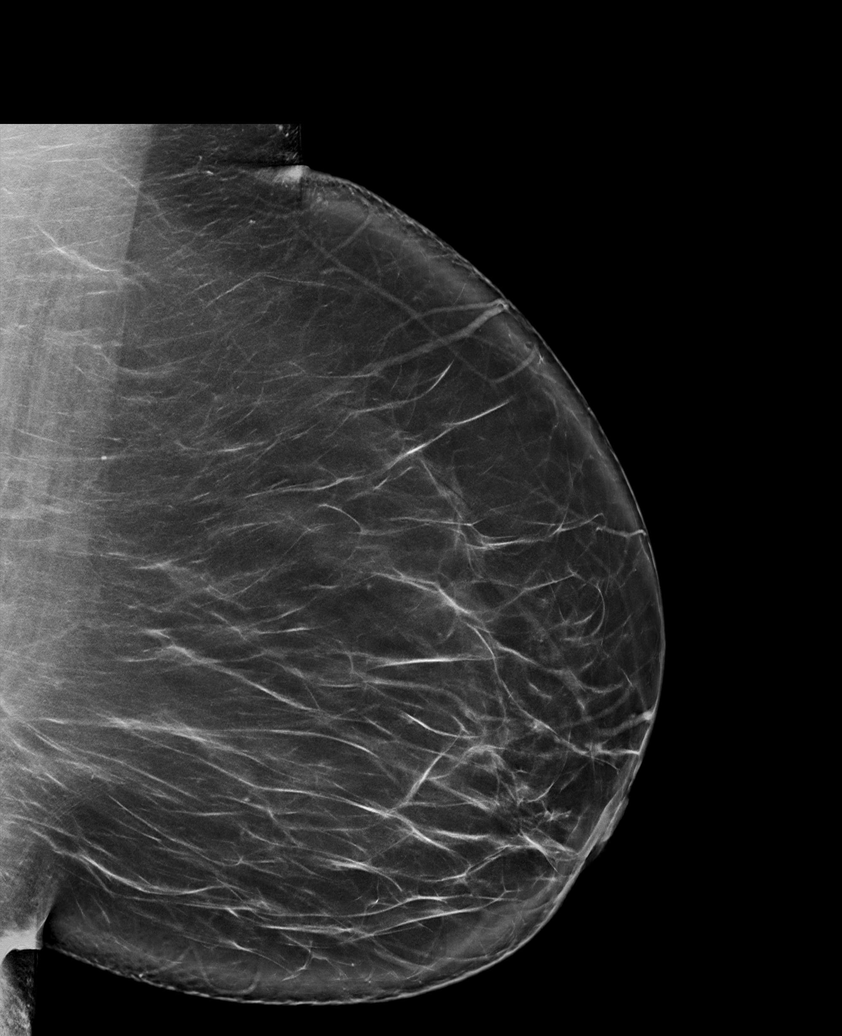

[R MLO synth-2D]
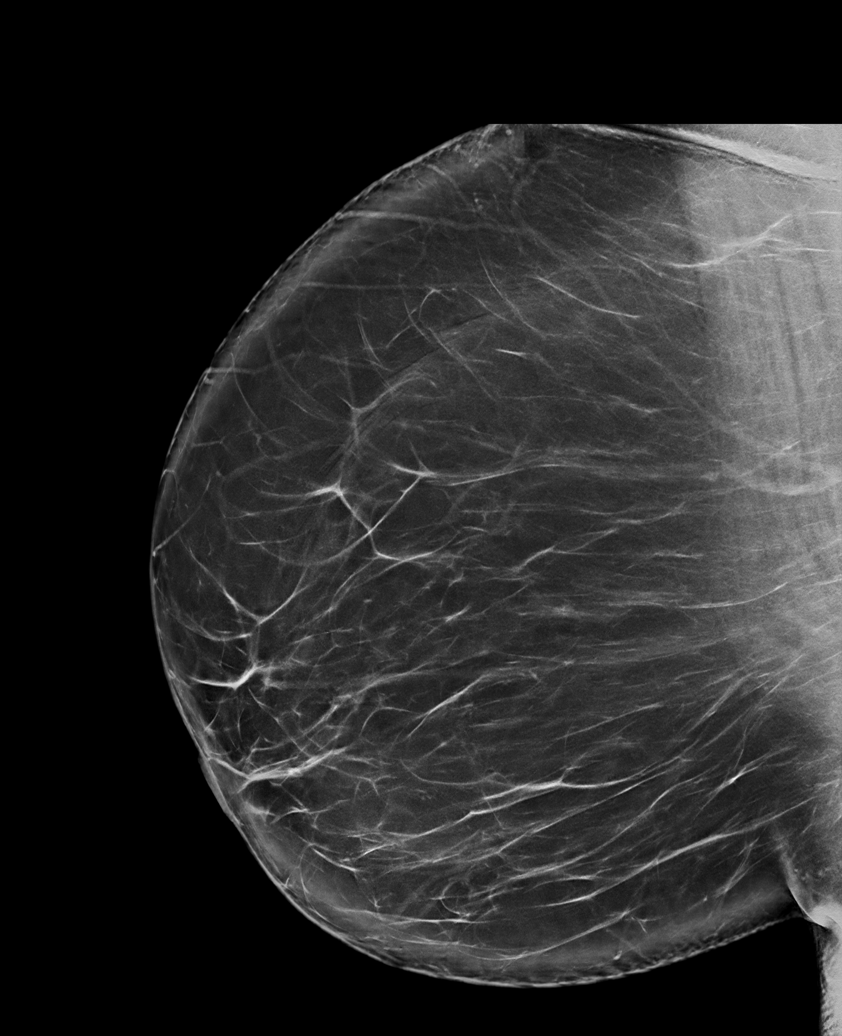

[L XCCL synth-2D]
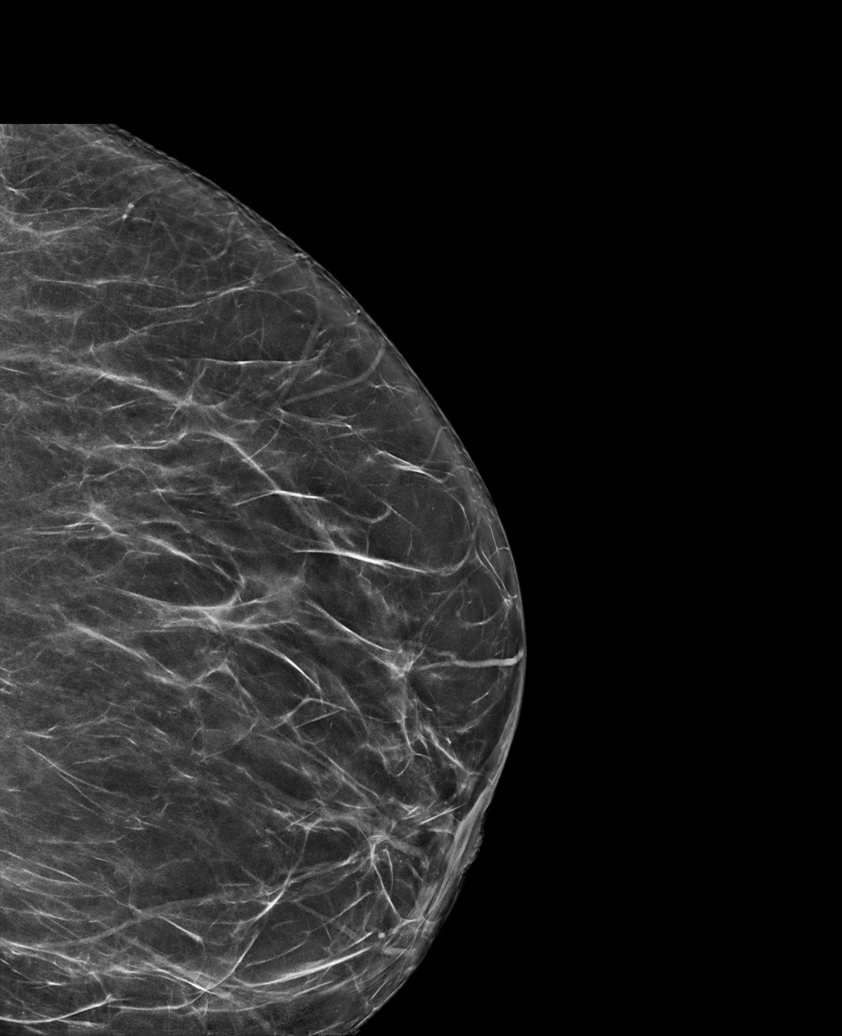

[R CC synth-2D]
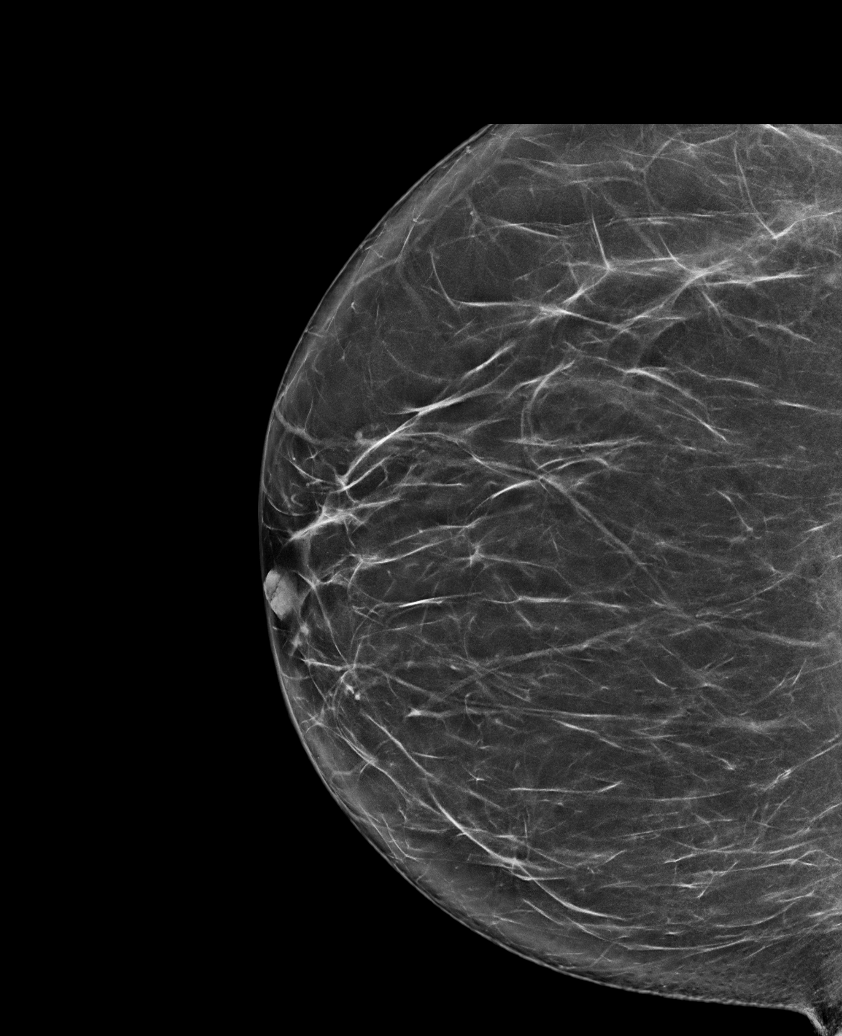

[L CC synth-2D]
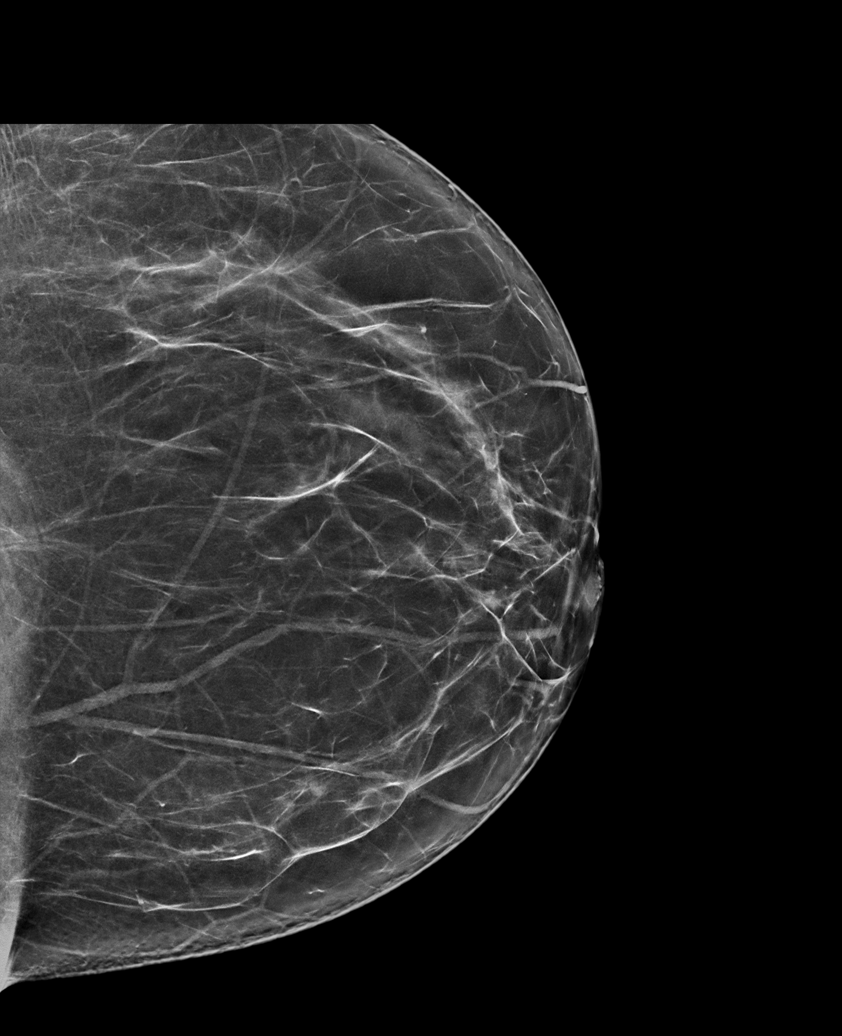

[6 of 36 positions shown; findings below may reference images not displayed]

ACR Breast Density Category b: There are scattered areas of
fibroglandular density.
FINDINGS: No suspicious mammographic findings are identified in either breast.
No focal masses are seen within the right axilla. Further evaluation
with ultrasound was performed.

Mammographic images were processed with CAD.

Targeted ultrasound is performed, showing normal axillary soft
tissue without focal or suspicious sonographic abnormality. No
enlarged lymph nodes are identified.
IMPRESSION: 1. No suspicious mammographic or sonographic findings corresponding
with the location of the patient's right axillary palpable lump.
2. No mammographic evidence of malignancy in either breast.

RECOMMENDATION:
1. Clinical follow-up recommended for the palpable area of concern
in the right axilla. Any further workup should be based on clinical
grounds.
2. Screening mammogram at age 38 unless there are persistent or
intervening clinical concerns. (Code:[id number]-[id number])

I have discussed the findings and recommendations with the patient.
Results were also provided in writing at the conclusion of the
visit. If applicable, a reminder letter will be sent to the patient
regarding the next appointment.

BI-RADS CATEGORY  1: Negative.

## 2019-12-22 DIAGNOSIS — R0683 Snoring: Secondary | ICD-10-CM | POA: Diagnosis not present

## 2019-12-22 DIAGNOSIS — R400 Somnolence: Secondary | ICD-10-CM | POA: Diagnosis not present

## 2019-12-22 DIAGNOSIS — R519 Headache, unspecified: Secondary | ICD-10-CM | POA: Diagnosis not present

## 2019-12-22 DIAGNOSIS — H8110 Benign paroxysmal vertigo, unspecified ear: Secondary | ICD-10-CM | POA: Diagnosis not present

## 2019-12-22 DIAGNOSIS — E039 Hypothyroidism, unspecified: Secondary | ICD-10-CM | POA: Diagnosis not present

## 2019-12-23 ENCOUNTER — Other Ambulatory Visit: Payer: Self-pay | Admitting: Family Medicine

## 2019-12-23 DIAGNOSIS — R519 Headache, unspecified: Secondary | ICD-10-CM

## 2020-01-06 DIAGNOSIS — M9901 Segmental and somatic dysfunction of cervical region: Secondary | ICD-10-CM | POA: Diagnosis not present

## 2020-01-07 DIAGNOSIS — R42 Dizziness and giddiness: Secondary | ICD-10-CM | POA: Diagnosis not present

## 2020-01-08 ENCOUNTER — Other Ambulatory Visit: Payer: Self-pay

## 2020-01-08 ENCOUNTER — Ambulatory Visit: Payer: BC Managed Care – PPO | Attending: Family Medicine | Admitting: Physical Therapy

## 2020-01-08 ENCOUNTER — Encounter: Payer: Self-pay | Admitting: Physical Therapy

## 2020-01-08 DIAGNOSIS — R42 Dizziness and giddiness: Secondary | ICD-10-CM | POA: Insufficient documentation

## 2020-01-08 DIAGNOSIS — R2681 Unsteadiness on feet: Secondary | ICD-10-CM | POA: Diagnosis not present

## 2020-01-08 NOTE — Patient Instructions (Signed)
Gaze Stabilization: Tip Card  1.Target must remain in focus, not blurry, and appear stationary while head is in motion. 2.Perform exercises with small head movements (45 to either side of midline). 3.Increase speed of head motion so long as target is in focus. 4.If you wear eyeglasses, be sure you can see target through lens (therapist will give specific instructions for bifocal / progressive lenses). 5.These exercises may provoke dizziness or nausea. Work through these symptoms. If too dizzy, slow head movement slightly. Rest between each exercise. 6.Exercises demand concentration; avoid distractions. 7.For safety, perform standing exercises close to a counter, wall, corner, or next to someone.     Gaze Stabilization: Standing Feet Apart    Feet shoulder width apart, keeping eyes on target on wall _6___ feet away, tilt head down 15-30 and move head side to side for __60__ seconds. Repeat while moving head up and down for _60___ seconds. Do _3-5___ sessions per day. Repeat using target on pattern background.    

## 2020-01-09 NOTE — Therapy (Signed)
[facility] [address] [place], [place], [address] Phone: [phone]   Fax:  [phone]  Physical Therapy Evaluation  Patient Details  Name: Rebecca [name] MRN: [id number] Date of Birth: 03/26/1986 Referring Provider (PT): Laurann Montana, MD   Encounter Date: 01/08/2020  PT End of Session - 01/09/20 1325    Visit Number  1    Number of Visits  5   eval + 4 visits   Date for PT Re-Evaluation  02/13/20    Authorization Type  [employer]    Authorization - Visit Number  1    Authorization - Number of Visits  30    PT Start Time  1617    PT Stop Time  1701    PT Time Calculation (min)  44 min    Activity Tolerance  Patient tolerated treatment well    Behavior During Therapy  [facility] for tasks assessed/performed       Past Medical History:  Diagnosis Date  . Hypertension   . Thyroid disease   . Tourette's syndrome     Past Surgical History:  Procedure Laterality Date  . CESAREAN SECTION    . CHOLECYSTECTOMY    . WISDOM TOOTH EXTRACTION      There were no vitals filed for this visit.   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  States she has h/o headaches but they haven't been bad recently - feels swimmy-headed all the time.  States she gets excited or talkative it gets worse with brain overstimulation.  April 30 - her friend who is a PT did oculomotor testing and says Rt eye had nystagmus but doesn't feel like her eyes are doing that now. Driving does not bother her because she is more focused during driving. Pt denies any nausea or vomiting. Always had tinnitus (15 -20 yrs) - but has not had any tinnitus since this started.    Diagnostic tests   MRI is scheduled on 01-10-20    Patient Stated Goals  resolve the vertigo - be normal again    Currently in Pain?  No/denies         [facility] PT Assessment - 01/09/20 0001      Assessment   Medical Diagnosis  Vertigo; BPPV (unspecified laterality_    Referring Provider (PT)  Laurann Montana, MD    Onset Date/Surgical Date  12/04/19    Prior Therapy  none      Precautions   Precautions  None      Restrictions   Weight Bearing Restrictions  No      Balance Screen   Has the patient fallen in the past 6 months  No    Has the patient had a decrease in activity level because of a fear of falling?   No    Is the patient reluctant to leave their home because of a fear of falling?   No      Prior Function   Level of Independence  Independent    Vocation  Full time employment    Vocation Requirements  pt is a CMA      Observation/Other Assessments   Focus on  Therapeutic Outcomes (FOTO)   pt's physical FS primary measure 41/100; risk adjusted statistical  67/100    Other Surveys   Dizziness Handicap Inventory [facility])    Dizziness Handicap Inventory [facility])   98% (severe handicap group)              Vestibular Assessment - 01/09/20 0001      Vestibular Assessment   General Observation  pt is a 34 yr old lady with c/o dizziness that started while she was having a massage on 12-04-19; states dizziness has gotten worse in past 2-3 weeks       Symptom Behavior   Subjective history of current problem  pt reports much difficulty with focusing with work on computer at her work; states dizziness has gotten worse in past 2-3 weeks    Type of Dizziness   Unsteady with head/body turns;Lightheadedness;"Funny feeling in head"    Frequency of Dizziness  daily    Duration of Dizziness  minutes to hours    Symptom Nature  Spontaneous;Variable;Motion provoked    Aggravating Factors  Turning head quickly;Moving eyes;Forward bending;Driving    Relieving Factors  Lying supine;Rest    Progression of  Symptoms  Worse    History of similar episodes  none prior to 12-04-19      Oculomotor Exam   Oculomotor Alignment  Normal    Spontaneous  Absent    Head shaking Horizontal  Absent    Head Shaking Vertical  Absent    Smooth Pursuits  Intact   > c/o dizziness with vertical than horizontal   Saccades  Intact    Comment  vertical head and eye movements provoke > vertigo than horizontal head and eye moevements      Oculomotor Exam-Fixation Suppressed    Ocular Alignment  WNL's      Visual Acuity   Static  line 10    Dynamic  line 9 (WNL's) but c/o incr. dizziness upon completion of test      Positional Testing   Dix-Hallpike  Dix-Hallpike Right;Dix-Hallpike Left    Sidelying Test  Sidelying Right;Sidelying Left      Dix-Hallpike Right   Dix-Hallpike Right Duration  none    Dix-Hallpike Right Symptoms  No nystagmus      Dix-Hallpike Left   Dix-Hallpike Left Duration  none    Dix-Hallpike Left Symptoms  No nystagmus      Sidelying Right   Sidelying Right Duration  none    Sidelying Right Symptoms  No nystagmus      Sidelying Left   Sidelying Left Duration  none    Sidelying Left Symptoms  No nystagmus      Positional Sensitivities   Up from Right Hallpike  Mild dizziness    Up from Left Hallpike  Mild dizziness    Positional Sensitivities Comments  up from Rt and Lt sidelying mild dizziness           Objective measurements completed on examination: See above findings.              PT Education - 01/09/20 1323    Education Details  eval results; x1 viewing for HEP    Person(s) Educated  Patient    Methods  Explanation;Demonstration;Handout    Comprehension  Verbalized understanding;Returned demonstration       PT Short Term Goals - 01/09/20 1336      PT SHORT TERM GOAL #1   Title  same as LTG's        PT  Long Term Goals - 01/09/20 1336      PT LONG TERM GOAL #1   Title  Pt will improve DHI score from 98% to </= 50% for improved quality of life.     Time  5    Period  Weeks    Status  New    Target Date  02/13/20      PT LONG TERM GOAL #2   Title  Complete SOT and estabish goal as appropriate.    Time  5    Period  Weeks    Status  New    Target Date  02/13/20      PT LONG TERM GOAL #3   Title  Pt will report no dizziness upon completion of DVA testing with pt having no > 2 line difference to indicate improved VOR for gaze stabilization.    Baseline  2 line difference with c/o dizziness    Time  5    Period  Weeks    Status  New    Target Date  02/13/20      PT LONG TERM GOAL #4   Title  Independent in HEP for balance and vestibular exercises.    Time  5    Period  Weeks    Status  New    Target Date  02/13/20             Plan - 01/09/20 1326    Clinical Impression Statement  Pt presents with c/o moderate to severe dizziness which she states initially started on 12-04-19 during a massage in which she was receiving deep tissue work to her neck and upper trap regions.  Pt states dizziness has worsened within past 2-3 weeks.  All positional testing is negative with no nystagmus noted and no c/o spinning vertigo in Rt nor Lt Dix-Hallpike test positions.  PLEASE NOTE:  Pt's SYMPTOMS ARE NOT CONSISTENT WITH BPPV ETIOLOGY AT THIS TIME.  Pt has motion sensitivity with movement and c/o dizziness with oculomotor testing and with head movements.  Pt's DVA is WNL's with a 1 line difference, however, pt c/o dizziness upon completion of testing.  Pt also reports increased dizziness with vertical head & eye movements compared to horizontal head and eye movements.  Pt presents with decreased high level balance skills and decreased vestibular input in maintaining balance.    Personal Factors and Comorbidities  Fitness;Comorbidity 1;Profession    Comorbidities  Tourette's syndrome, h/o migraine, anxiety, HTN    Examination-Activity Limitations  Locomotion Level;Transfers;Bed Mobility;Other;Bend   work activities - computer work    Psychiatric nurse Activity;Driving;Interpersonal Relationship;Laundry;Other   work   Conservation officer, historic buildings  Evolving/Moderate complexity    Clinical Decision Making  Moderate    Rehab Potential  Good    PT Frequency  1x / week    PT Duration  --   5 weeks   PT Treatment/Interventions  ADLs/Self Care Home Management;Vestibular;Gait training;Patient/family education;Therapeutic activities;Therapeutic exercise;Balance training;Neuromuscular re-education    PT Next Visit Plan  do SOT - check x1 viewing ex given for HEP    PT Home Exercise Plan  x1viewing    Consulted and Agree with Plan of Care  Patient       Patient will benefit from skilled therapeutic intervention in order to improve the following deficits and impairments:  Dizziness, Decreased balance  Visit Diagnosis: Dizziness and giddiness - Plan: PT plan of care cert/re-cert  Unsteadiness on feet - Plan: PT plan of care cert/re-cert  Problem List Patient Active Problem List   Diagnosis Date Noted  . Migraine 03/24/2019  . UNSPECIFIED VITAMIN D DEFICIENCY 10/22/2008  . ASYMPTOMATIC VARICOSE VEINS 09/23/2008  . HYPOTHYROIDISM 08/06/2007  . ANXIETY STATE, UNSPECIFIED 08/06/2007  . TOURETTE'S DISORDER 08/06/2007  . ADD 08/06/2007  . HYPERTENSION 08/06/2007  . URTICARIA 08/06/2007  . ISCHEMIC COLITIS, HX OF 08/06/2007    Alda Lea, PT 01/09/2020, 1:47 PM  [facility] [address] [place], [place], [address] Phone: [phone]   Fax:  [phone]  Name: Rebecca [name] MRN: [id number] Date of Birth: 16-Dec-1985

## 2020-01-10 ENCOUNTER — Inpatient Hospital Stay: Admission: RE | Admit: 2020-01-10 | Payer: BC Managed Care – PPO | Source: Ambulatory Visit

## 2020-01-11 ENCOUNTER — Ambulatory Visit
Admission: RE | Admit: 2020-01-11 | Discharge: 2020-01-11 | Disposition: A | Discharge status: Home / Self Care | Payer: BC Managed Care – PPO | Source: Ambulatory Visit | Attending: Family Medicine | Admitting: Family Medicine

## 2020-01-11 ENCOUNTER — Other Ambulatory Visit: Payer: Self-pay | Admitting: Family Medicine

## 2020-01-11 DIAGNOSIS — R519 Headache, unspecified: Secondary | ICD-10-CM

## 2020-01-11 MED ORDER — GADOBENATE DIMEGLUMINE 529 MG/ML IV SOLN: 20.0000 mL | Freq: Once | INTRAVENOUS | Status: DC | PRN

## 2020-01-14 ENCOUNTER — Other Ambulatory Visit: Payer: Self-pay | Admitting: Family Medicine

## 2020-01-14 ENCOUNTER — Ambulatory Visit
Admission: RE | Admit: 2020-01-14 | Discharge: 2020-01-14 | Disposition: A | Discharge status: Home / Self Care | Payer: BC Managed Care – PPO | Source: Ambulatory Visit | Attending: Family Medicine | Admitting: Family Medicine

## 2020-01-14 ENCOUNTER — Other Ambulatory Visit: Payer: Self-pay

## 2020-01-14 DIAGNOSIS — R519 Headache, unspecified: Secondary | ICD-10-CM

## 2020-01-16 DIAGNOSIS — G43009 Migraine without aura, not intractable, without status migrainosus: Secondary | ICD-10-CM | POA: Diagnosis not present

## 2020-01-16 DIAGNOSIS — H52223 Regular astigmatism, bilateral: Secondary | ICD-10-CM | POA: Diagnosis not present

## 2020-01-17 ENCOUNTER — Other Ambulatory Visit: Payer: BC Managed Care – PPO

## 2020-01-19 ENCOUNTER — Ambulatory Visit: Payer: BC Managed Care – PPO | Admitting: Physical Therapy

## 2020-01-28 ENCOUNTER — Other Ambulatory Visit: Payer: BC Managed Care – PPO

## 2020-01-29 ENCOUNTER — Ambulatory Visit: Payer: BC Managed Care – PPO | Admitting: Cardiology

## 2020-02-09 ENCOUNTER — Encounter: Payer: BC Managed Care – PPO | Admitting: Physical Therapy

## 2020-02-09 ENCOUNTER — Other Ambulatory Visit: Payer: BC Managed Care – PPO

## 2020-02-24 ENCOUNTER — Encounter: Payer: Self-pay | Admitting: Neurology

## 2020-02-24 ENCOUNTER — Ambulatory Visit: Payer: BC Managed Care – PPO | Admitting: Neurology

## 2020-02-24 VITALS — BP 120/86 | HR 81 | Ht 65.0 in | Wt 262.0 lb

## 2020-02-24 DIAGNOSIS — E538 Deficiency of other specified B group vitamins: Secondary | ICD-10-CM | POA: Diagnosis not present

## 2020-02-24 DIAGNOSIS — G43009 Migraine without aura, not intractable, without status migrainosus: Secondary | ICD-10-CM | POA: Diagnosis not present

## 2020-02-24 MED ORDER — RIZATRIPTAN BENZOATE 10 MG PO TBDP: 10.0000 mg | ORAL_TABLET | ORAL | 11 refills | Status: DC | PRN

## 2020-02-24 NOTE — Progress Notes (Addendum)
[facility]    Provider:  Dr Lucia Gaskins Requesting Provider: Laurann Montana, MD Primary Care Provider:  Laurann Montana, MD  CC:  Daily headaches  HPI:  Rebecca [name] is a 34 y.o. female here as requested by Laurann Montana, MD for headache. PMHx hypertension, migraine, hypothyroidism, Tourette's disorder, anxiety, panic attacks, hearing loss since high school, obesity, vitamin B12 insufficiency, snoring, daytime sleepiness.  I reviewed Dr. Warden Fillers notes, patient has excessive fatigue, loud snoring, it appears she is ordered a home study, an MRI of the brain with and without contrast, these were at last appointment.  Per Dr. Cliffton Asters, patient with dizziness and headaches, she has had headaches for many years but recently getting worse, for a month happening every day, ibuprofen has not helped, the headaches behind her eyes, room spinning, lightheaded like she is going to pass out with the headaches, blood pressure during these episodes are 140s over 90s, she does not wake up with headaches they come on as the day goes on, headaches are 10 out of 10 in both eyes, dull, has to leave work because they are so bad, recently new onset dizziness and vertigo, movement makes it worse if she moves her head side to side and up and down she may pass out or feel like she may pass out, has phonophobia, mild photophobia, rarely nausea, can last 24 hours or more, she has what she describes both headaches and migraines, headaches are becoming more frequent, she was worked up to the full dose of Topamax a week, not drinking much caffeine no alcohol, joined weight watchers several weeks prior, ibuprofen helps but does not make it go away.  Patient recognizes that she may have sleep apnea she snores very loudly.  I also reviewed lab work which was taken October 10, 2019, thyroid normal, CMP normal with BUN 12 and creatinine 0.65, CBC essentially normal.  She is here alone but she feels she is improved. She  has had headaches for years since a teenager, topamax has helped a lot and she has been on it for years. She had the covid vaccine in February and march and that's when the headaches and dizziness worsened. Started after the 2nd vaccine, terrible headaches started, she had a headache for 7-8 weeks straight, prior to that she was well controlled on the topamax. She felt dizzy for 5 weeks, she felt like she was going to pass out, lightheaded for 5 weeks, random no association with any triggers, the headaches went away about 20 days ago, she is not back to complete normal she still may get dizziness but it is improving, 95% improved, the headaches were in the frontal and temple areas, throbbing pressure, they were much different than her migraines she felt like her head would blow up and she was taking ibuprofen which did not help. She had one again Sunday, only in the left eye, light sensitivity, sound sensitivity, nausea, she went to bed which helped. Now headaches/migraines are 1-2x a week but improving however more intense. She declines the MRI at this time. They last for hours now and ibuprofen doesn;t work. No weakness, no sensory changes, no gait or speech changes. She would like to hold off on changing any preventative but discuss acute. She has been on b12 shots. She has started having tingling in her fingers without any changes to topamax. No other focal neurologic deficits, associated symptoms, inciting events or modifiable factors.  Reviewed notes, labs and imaging from outside physicians, which showed:  Per thorough review of records medications that patient has been on that can be used to treat migraines include Tylenol 3, Topamax, clonazepam, Flexeril, Topamax, ibuprofen, metoprolol, lisinopril, sertraline, valsartan.    CT head 01/14/2020:  showed No acute intracranial abnormalities including mass lesion or mass effect, hydrocephalus, extra-axial fluid collection, midline shift, hemorrhage, or  acute infarction, large ischemic events (personally reviewed images)     Review of Systems: Patient complains of symptoms per HPI as well as the following symptoms: headache and dizziness. Pertinent negatives and positives per HPI. All others negative.   Social History   Socioeconomic History  . Marital status: Married    Spouse name: Not on file  . Number of children: 2  . Years of education: associates  . Highest education level: Not on file  Occupational History  . Not on file  Tobacco Use  . Smoking status: Never Smoker  . Smokeless tobacco: Never Used  Substance and Sexual Activity  . Alcohol use: Never  . Drug use: Never  . Sexual activity: Yes    Birth control/protection: Other-see comments    Comment: husband vasectomy  Other Topics Concern  . Not on file  Social History Narrative   Lives at home with husband and 2 children   Left handed   Caffeine: 1-2 cups/day   Social Determinants of Health   Financial Resource Strain:   . Difficulty of Paying Living Expenses:   Food Insecurity:   . Worried About Programme researcher, broadcasting/film/video in the Last Year:   . Barista in the Last Year:   Transportation Needs:   . Freight forwarder (Medical):   [name] Lack of Transportation (Non-Medical):   Physical Activity:   . Days of Exercise per Week:   . Minutes of Exercise per Session:   Stress:   . Feeling of Stress :   Social Connections:   . Frequency of Communication with Friends and Family:   . Frequency of Social Gatherings with Friends and Family:   . Attends Religious Services:   . Active Member of Clubs or Organizations:   . Attends Banker Meetings:   [name] Marital Status:   Intimate Partner Violence:   . Fear of Current or Ex-Partner:   . Emotionally Abused:   [name] Physically Abused:   . Sexually Abused:     Family History  Problem Relation Age of Onset  . Heart disease Father   . Hypertension Father   . Heart attack Father   . Asthma Son   .  ADD / ADHD Son   . Hypertension Paternal Grandmother   . Cancer Paternal Grandfather   . Headache Mother     Past Medical History:  Diagnosis Date  . GERD (gastroesophageal reflux disease)   . Hypertension   . Migraine   . Thyroid disease   . Tourette's syndrome     Patient Active Problem List   Diagnosis Date Noted  . Migraine without aura and without status migrainosus, not intractable 02/24/2020  . Migraine 03/24/2019  . UNSPECIFIED VITAMIN D DEFICIENCY 10/22/2008  . ASYMPTOMATIC VARICOSE VEINS 09/23/2008  . HYPOTHYROIDISM 08/06/2007  . ANXIETY STATE, UNSPECIFIED 08/06/2007  . TOURETTE'S DISORDER 08/06/2007  . ADD 08/06/2007  . HYPERTENSION 08/06/2007  . URTICARIA 08/06/2007  . ISCHEMIC COLITIS, HX OF 08/06/2007    Past Surgical History:  Procedure Laterality Date  . ABLATION     uterine  . CESAREAN SECTION    . CHOLECYSTECTOMY    .  TONSILLECTOMY    . WISDOM TOOTH EXTRACTION      Current Outpatient Medications  Medication Sig Dispense Refill  . clonazePAM (KLONOPIN) 1 MG tablet Take 1 mg by mouth at bedtime as needed for anxiety.     [name] levothyroxine (SYNTHROID, LEVOTHROID) 75 MCG tablet Take 75 mcg by mouth daily before breakfast.     . metoprolol succinate (TOPROL-XL) 100 MG 24 hr tablet Take 100 mg by mouth daily. Take with or immediately following a meal.    . metoprolol succinate (TOPROL-XL) 50 MG 24 hr tablet Take 100 mg by mouth daily.     . Multiple Vitamins-Minerals (MULTIVITAMIN PO) Take 1 tablet by mouth daily.    [name] omeprazole (PRILOSEC) 20 MG capsule omeprazole 20 mg cpdr    . pimozide (ORAP) 2 MG tablet Take 4 mg by mouth daily.     [name] topiramate (TOPAMAX) 100 MG tablet Take 100 mg by mouth at bedtime.    . valsartan (DIOVAN) 320 MG tablet Take 320 mg by mouth daily.    . rizatriptan (MAXALT-MLT) 10 MG disintegrating tablet Take 1 tablet (10 mg total) by mouth as needed for migraine. May repeat in 2 hours if needed 9 tablet 11   No current  facility-administered medications for this visit.    Allergies as of 02/24/2020 - Review Complete 02/24/2020  Allergen Reaction Noted  . Diphenhydramine hcl  08/06/2007  . Promethazine hcl  08/06/2007  . Pseudoephedrine  08/06/2007  . Sulfonamide derivatives  08/06/2007  . Zithromax [azithromycin]  04/01/2013    Vitals: BP 120/86 (BP Location: Right Arm, Patient Position: Sitting)   Pulse 81   Ht 5\' 5"  (1.651 m)   Wt 262 lb (118.8 kg)   BMI 43.60 kg/m  Last Weight:  Wt Readings from Last 1 Encounters:  02/24/20 262 lb (118.8 kg)   Last Height:   Ht Readings from Last 1 Encounters:  02/24/20 5\' 5"  (1.651 m)     Physical exam: Exam: Gen: NAD, conversant, well nourised, obese, well groomed                     CV: RRR, no MRG. No Carotid Bruits. No peripheral edema, warm, nontender Eyes: Conjunctivae clear without exudates or hemorrhage  Neuro: Detailed Neurologic Exam  Speech:    Speech is normal; fluent and spontaneous with normal comprehension.  Cognition:    The patient is oriented to person, place, and time;     recent and remote memory intact;     language fluent;     normal attention, concentration,     fund of knowledge Cranial Nerves:    The pupils are equal, round, and reactive to light. The fundi are normal and spontaneous venous pulsations are present. Visual fields are full to finger confrontation. Extraocular movements are intact. Trigeminal sensation is intact and the muscles of mastication are normal. The face is symmetric. The palate elevates in the midline. Hearing intact. Voice is normal. Shoulder shrug is normal. The tongue has normal motion without fasciculations.   Coordination:    Normal   Gait:    Normal native gait  Motor Observation:    No asymmetry, no atrophy, and no involuntary movements noted. Tone:    Normal muscle tone.    Posture:    Posture is normal. normal erect    Strength:    Strength is V/V in the upper and lower  limbs.      Sensation: intact to LT  Reflex Exam:  DTR's: trace Ajs and patellars, 1+ biceps,  symmetrical bilaterally.   Toes:    The toes are downgoing bilaterally.   Clonus:    Clonus is absent.    Assessment/Plan: Really lovely 34 year old patient with a history of migraines without aura who had exacerbation of headaches after her Covid vaccine, likely she is feeling improved, CT of the head was negative, at this time she wants to hold off on the MRI, we will continue her on topiramate and add acute management Maxalt.  Neurologic exam normal.  -Rizatriptan and ibuprofen acutely -Continue topiramate for migraine prevention -For her morbid obesity I do recommend a healthy weight and wellness center, we discussed and I have referred her -Recommend following through with a sleep evaluation per Dr. Cliffton AstersWhite discussed sequelae of untreated sleep apnea -We will check her B12, she had B12 deficiency, she has been on injections, if B12 looks good she could likely go to oral B12 daily or/and increase foods rich with B12 in her diet  - To prevent or relieve headaches, try the following: Cool Compress. Lie down and place a cool compress on your head.  Avoid headache triggers. If certain foods or odors seem to have triggered your migraines in the past, avoid them. A headache diary might help you identify triggers.  Include physical activity in your daily routine. Try a daily walk or other moderate aerobic exercise.  Manage stress. Find healthy ways to cope with the stressors, such as delegating tasks on your to-do list.  Practice relaxation techniques. Try deep breathing, yoga, massage and visualization.  Eat regularly. Eating regularly scheduled meals and maintaining a healthy diet might help prevent headaches. Also, drink plenty of fluids.  Follow a regular sleep schedule. Sleep deprivation might contribute to headaches Consider biofeedback. With this mind-body technique, you learn to control  certain bodily functions -- such as muscle tension, heart rate and blood pressure -- to prevent headaches or reduce headache pain.    Proceed to emergency room if you experience new or worsening symptoms or symptoms do not resolve, if you have new neurologic symptoms or if headache is severe, or for any concerning symptom.   Provided education and documentation from American headache Society toolbox including articles on: chronic migraine medication overuse headache, chronic migraines, prevention of migraines, behavioral and other nonpharmacologic treatments for headache.   Orders Placed This Encounter  Procedures  . B12 and Folate Panel  . Methylmalonic acid, serum  . [facility]   Meds ordered this encounter  Medications  . rizatriptan (MAXALT-MLT) 10 MG disintegrating tablet    Sig: Take 1 tablet (10 mg total) by mouth as needed for migraine. May repeat in 2 hours if needed    Dispense:  9 tablet    Refill:  11    Cc: Laurann MontanaWhite, Cynthia, MD,  Laurann MontanaWhite, Cynthia, MD  Naomie DeanAntonia Rosann Gorum, MD  [facility] [address] [place], [place] [id number]  Phone [phone] Fax [phone]

## 2020-02-24 NOTE — Patient Instructions (Addendum)
-Rizatriptan and ibuprofen acutely: Please take one tablet at the onset of your headache. If it does not improve the symptoms please take one additional tablet. Do not take more then 2 tablets in 24hrs. Do not take use more then 2 to 3 times in a week. -Continue topiramate for migraine prevention -recommend a healthy weight and wellness center, we discussed and I have referred her -Recommend following through with a sleep evaluation per Dr. Cliffton Asters  -We will check her B12, she had B12 deficiency, she has been on injections, if B12 looks good she could likely go to oral B12 daily or/and increase foods rich with B12 in her diet  Rizatriptan disintegrating tablets What is this medicine? RIZATRIPTAN (rye za TRIP tan) is used to treat migraines with or without aura. An aura is a strange feeling or visual disturbance that warns you of an attack. It is not used to prevent migraines. This medicine may be used for other purposes; ask your health care provider or pharmacist if you have questions. COMMON BRAND NAME(S): Maxalt-MLT What should I tell my health care provider before I take this medicine? They need to know if you have any of these conditions:  cigarette smoker  circulation problems in fingers and toes  diabetes  heart disease  high blood pressure  high cholesterol  history of irregular heartbeat  history of stroke  kidney disease  liver disease  stomach or intestine problems  an unusual or allergic reaction to rizatriptan, other medicines, foods, dyes, or preservatives  pregnant or trying to get pregnant  breast-feeding How should I use this medicine? Take this medicine by mouth. Follow the directions on the prescription label. Leave the tablet in the sealed blister pack until you are ready to take it. With dry hands, open the blister and gently remove the tablet. If the tablet breaks or crumbles, throw it away and take a new tablet out of the blister pack. Place the tablet  in the mouth and allow it to dissolve, and then swallow. Do not cut, crush, or chew this medicine. You do not need water to take this medicine. Do not take it more often than directed. Talk to your pediatrician regarding the use of this medicine in children. While this drug may be prescribed for children as young as 6 years for selected conditions, precautions do apply. Overdosage: If you think you have taken too much of this medicine contact a poison control center or emergency room at once. NOTE: This medicine is only for you. Do not share this medicine with others. What if I miss a dose? This does not apply. This medicine is not for regular use. What may interact with this medicine? Do not take this medicine with any of the following medicines:  certain medicines for migraine headache like almotriptan, eletriptan, frovatriptan, naratriptan, rizatriptan, sumatriptan, zolmitriptan  ergot alkaloids like dihydroergotamine, ergonovine, ergotamine, methylergonovine  MAOIs like Carbex, Eldepryl, Marplan, Nardil, and Parnate This medicine may also interact with the following medications:  certain medicines for depression, anxiety, or psychotic disorders  propranolol This list may not describe all possible interactions. Give your health care provider a list of all the medicines, herbs, non-prescription drugs, or dietary supplements you use. Also tell them if you smoke, drink alcohol, or use illegal drugs. Some items may interact with your medicine. What should I watch for while using this medicine? Visit your healthcare professional for regular checks on your progress. Tell your healthcare professional if your symptoms do not start to  get better or if they get worse. You may get drowsy or dizzy. Do not drive, use machinery, or do anything that needs mental alertness until you know how this medicine affects you. Do not stand up or sit up quickly, especially if you are an older patient. This reduces  the risk of dizzy or fainting spells. Alcohol may interfere with the effect of this medicine. Your mouth may get dry. Chewing sugarless gum or sucking hard candy and drinking plenty of water may help. Contact your healthcare professional if the problem does not go away or is severe. If you take migraine medicines for 10 or more days a month, your migraines may get worse. Keep a diary of headache days and medicine use. Contact your healthcare professional if your migraine attacks occur more frequently. What side effects may I notice from receiving this medicine? Side effects that you should report to your doctor or health care professional as soon as possible:  allergic reactions like skin rash, itching or hives, swelling of the face, lips, or tongue  chest pain or chest tightness  signs and symptoms of a dangerous change in heartbeat or heart rhythm like chest pain; dizziness; fast, irregular heartbeat; palpitations; feeling faint or lightheaded; falls; breathing problems  signs and symptoms of a stroke like changes in vision; confusion; trouble speaking or understanding; severe headaches; sudden numbness or weakness of the face, arm or leg; trouble walking; dizziness; loss of balance or coordination  signs and symptoms of serotonin syndrome like irritable; confusion; diarrhea; fast or irregular heartbeat; muscle twitching; stiff muscles; trouble walking; sweating; high fever; seizures; chills; vomiting Side effects that usually do not require medical attention (report to your doctor or health care professional if they continue or are bothersome):  diarrhea  dizziness  drowsiness  dry mouth  headache  nausea, vomiting  pain, tingling, numbness in the hands or feet  stomach pain This list may not describe all possible side effects. Call your doctor for medical advice about side effects. You may report side effects to FDA at 1-800-FDA-1088. Where should I keep my medicine? Keep out of  the reach of children. Store at room temperature between 15 and 30 degrees C (59 and 86 degrees F). Protect from light and moisture. Throw away any unused medicine after the expiration date. NOTE: This sheet is a summary. It may not cover all possible information. If you have questions about this medicine, talk to your doctor, pharmacist, or health care provider.  2020 Elsevier/Gold Standard (2018-02-26 14:58:08)

## 2020-02-27 LAB — METHYLMALONIC ACID, SERUM: Methylmalonic Acid: 96 nmol/L (ref 0–378)

## 2020-02-27 LAB — B12 AND FOLATE PANEL
Folate: 6 ng/mL (ref >3.0)
Vitamin B-12: 1182 pg/mL (ref 232–1245)

## 2020-03-16 ENCOUNTER — Encounter (INDEPENDENT_AMBULATORY_CARE_PROVIDER_SITE_OTHER): Payer: Self-pay | Admitting: Family Medicine

## 2020-03-16 ENCOUNTER — Ambulatory Visit (INDEPENDENT_AMBULATORY_CARE_PROVIDER_SITE_OTHER): Payer: BC Managed Care – PPO | Admitting: Family Medicine

## 2020-03-16 ENCOUNTER — Other Ambulatory Visit: Payer: Self-pay

## 2020-03-16 VITALS — BP 106/72 | HR 69 | Temp 97.7°F | Ht 65.0 in | Wt 258.0 lb

## 2020-03-16 DIAGNOSIS — Z9189 Other specified personal risk factors, not elsewhere classified: Secondary | ICD-10-CM | POA: Diagnosis not present

## 2020-03-16 DIAGNOSIS — R0602 Shortness of breath: Secondary | ICD-10-CM

## 2020-03-16 DIAGNOSIS — Z0289 Encounter for other administrative examinations: Secondary | ICD-10-CM

## 2020-03-16 DIAGNOSIS — R5383 Other fatigue: Secondary | ICD-10-CM | POA: Diagnosis not present

## 2020-03-16 DIAGNOSIS — I10 Essential (primary) hypertension: Secondary | ICD-10-CM | POA: Diagnosis not present

## 2020-03-16 DIAGNOSIS — F3289 Other specified depressive episodes: Secondary | ICD-10-CM

## 2020-03-16 DIAGNOSIS — E038 Other specified hypothyroidism: Secondary | ICD-10-CM | POA: Diagnosis not present

## 2020-03-16 DIAGNOSIS — E559 Vitamin D deficiency, unspecified: Secondary | ICD-10-CM | POA: Diagnosis not present

## 2020-03-16 DIAGNOSIS — Z6841 Body Mass Index (BMI) 40.0 and over, adult: Secondary | ICD-10-CM

## 2020-03-17 ENCOUNTER — Encounter (INDEPENDENT_AMBULATORY_CARE_PROVIDER_SITE_OTHER): Payer: Self-pay | Admitting: Family Medicine

## 2020-03-17 LAB — CBC WITH DIFFERENTIAL/PLATELET
Basophils Absolute: 0.1 10*3/uL (ref 0.0–0.2)
Basos: 1 %
EOS (ABSOLUTE): 0.1 10*3/uL (ref 0.0–0.4)
Eos: 2 %
Hematocrit: 41.7 % (ref 34.0–46.6)
Hemoglobin: 13 g/dL (ref 11.1–15.9)
Immature Grans (Abs): 0 10*3/uL (ref 0.0–0.1)
Immature Granulocytes: 0 %
Lymphocytes Absolute: 2.9 10*3/uL (ref 0.7–3.1)
Lymphs: 31 %
MCH: 26.3 pg — ABNORMAL LOW (ref 26.6–33.0)
MCHC: 31.2 g/dL — ABNORMAL LOW (ref 31.5–35.7)
MCV: 84 fL (ref 79–97)
Monocytes Absolute: 0.5 10*3/uL (ref 0.1–0.9)
Monocytes: 6 %
Neutrophils Absolute: 5.8 10*3/uL (ref 1.4–7.0)
Neutrophils: 60 %
Platelets: 311 10*3/uL (ref 150–450)
RBC: 4.95 x10E6/uL (ref 3.77–5.28)
RDW: 14.9 % (ref 11.7–15.4)
WBC: 9.4 10*3/uL (ref 3.4–10.8)

## 2020-03-17 LAB — VITAMIN B12: Vitamin B-12: 688 pg/mL (ref 232–1245)

## 2020-03-17 LAB — COMPREHENSIVE METABOLIC PANEL
ALT: 17 IU/L (ref 0–32)
AST: 11 IU/L (ref 0–40)
Albumin/Globulin Ratio: 1.7 (ref 1.2–2.2)
Albumin: 4 g/dL (ref 3.8–4.8)
Alkaline Phosphatase: 88 IU/L (ref 48–121)
BUN/Creatinine Ratio: 16 (ref 9–23)
BUN: 11 mg/dL (ref 6–20)
Bilirubin Total: 0.3 mg/dL (ref 0.0–1.2)
CO2: 19 mmol/L — ABNORMAL LOW (ref 20–29)
Calcium: 9.1 mg/dL (ref 8.7–10.2)
Chloride: 105 mmol/L (ref 96–106)
Creatinine, Ser: 0.67 mg/dL (ref 0.57–1.00)
GFR calc Af Amer: 131 mL/min/{1.73_m2} (ref >59)
GFR calc non Af Amer: 113 mL/min/{1.73_m2} (ref >59)
Globulin, Total: 2.3 g/dL (ref 1.5–4.5)
Glucose: 85 mg/dL (ref 65–99)
Potassium: 4.2 mmol/L (ref 3.5–5.2)
Sodium: 138 mmol/L (ref 134–144)
Total Protein: 6.3 g/dL (ref 6.0–8.5)

## 2020-03-17 LAB — VITAMIN D 25 HYDROXY (VIT D DEFICIENCY, FRACTURES): Vit D, 25-Hydroxy: 40.8 ng/mL (ref 30.0–100.0)

## 2020-03-17 LAB — LIPID PANEL WITH LDL/HDL RATIO
Cholesterol, Total: 158 mg/dL (ref 100–199)
HDL: 35 mg/dL — ABNORMAL LOW (ref >39)
LDL Chol Calc (NIH): 90 mg/dL (ref 0–99)
LDL/HDL Ratio: 2.6 ratio (ref 0.0–3.2)
Triglycerides: 193 mg/dL — ABNORMAL HIGH (ref 0–149)
VLDL Cholesterol Cal: 33 mg/dL (ref 5–40)

## 2020-03-17 LAB — HEMOGLOBIN A1C
Est. average glucose Bld gHb Est-mCnc: 120 mg/dL
Hgb A1c MFr Bld: 5.8 % — ABNORMAL HIGH (ref 4.8–5.6)

## 2020-03-17 LAB — INSULIN, RANDOM: INSULIN: 74.4 u[IU]/mL — ABNORMAL HIGH (ref 2.6–24.9)

## 2020-03-17 LAB — TSH: TSH: 2.03 u[IU]/mL (ref 0.450–4.500)

## 2020-03-17 LAB — T4, FREE: Free T4: 1.1 ng/dL (ref 0.82–1.77)

## 2020-03-17 LAB — T3: T3, Total: 145 ng/dL (ref 71–180)

## 2020-03-17 NOTE — Telephone Encounter (Signed)
Please advise 

## 2020-03-18 NOTE — Progress Notes (Signed)
Chief Complaint:   OBESITY Rebecca [name] (MR# [id number]) is a 34 y.o. female who presents for evaluation and treatment of obesity and related comorbidities. Current BMI is Body mass index is 42.93 kg/m. Rebecca [name] has been struggling with her weight for many years and has been unsuccessful in either losing weight, maintaining weight loss, or reaching her healthy weight goal.  Rebecca [name] lives with her husband and her 2 sons, and she is feeling they are unsupportive of her weight loss efforts and will not be eating healthy with her.  Rebecca [name] is currently in the action stage of change and ready to dedicate time achieving and maintaining a healthier weight. Rebecca [name] is interested in becoming our patient and working on intensive lifestyle modifications including (but not limited to) diet and exercise for weight loss.  Rebecca [name]'s habits were reviewed today and are as follows: Her family eats meals together, her desired weight loss is 108-118 lbs, she started gaining weight about 3 years ago, her heaviest weight ever was 263 pounds, she has significant food cravings issues, she skips meals frequently, she is frequently drinking liquids with calories, she frequently makes poor food choices, she frequently eats larger portions than normal and she struggles with emotional eating.  Depression Screen Rebecca [name]'s Food and Mood (modified PHQ-9) score was 17.  Depression screen PHQ 2/9 03/16/2020  Decreased Interest 3  Down, Depressed, Hopeless 1  PHQ - 2 Score 4  Altered sleeping 3  Tired, decreased energy 3  Change in appetite 3  Feeling bad or failure about yourself  2  Trouble concentrating 2  Moving slowly or fidgety/restless 0  Suicidal thoughts 0  PHQ-9 Score 17  Difficult doing work/chores Not difficult at all   Subjective:   1. Other fatigue Rebecca [name] admits to daytime somnolence and admits to waking up still tired. Patent has a history of symptoms of daytime fatigue. Rebecca [name]  generally gets 5 or 6 hours of sleep per night, and states that she has nightime awakenings. Snoring is present. Apneic episodes are present. Epworth Sleepiness Score is 6.  2. Shortness of breath on exertion Jacklyne notes increasing shortness of breath with exercising and seems to be worsening over time with weight gain. She notes getting out of breath sooner with activity than she used to. This has not gotten worse recently. Curry denies shortness of breath at rest or orthopnea.  3. Other specified hypothyroidism Rebecca [name] is on Synthroid, and she still notes fatigue.  4. Essential hypertension Rebecca [name]'s blood pressure is well controlled on her medications. She denies chest pain or headache.  5. Vitamin D deficiency Rebecca [name] has a history of low Vit D. She is not on Vit D currently and she notes fatigue.  6. Other depression with emotional eating Rebecca [name] has a history of emotional eating and questionable family support.  7. At risk for heart disease Rebecca [name] is at a higher than average risk for cardiovascular disease due to obesity and hypertension.   Assessment/Plan:   1. Other fatigue Novalyn does feel that her weight is causing her energy to be lower than it should be. Fatigue may be related to obesity, depression or many other causes. Labs will be ordered, and in the meanwhile, Parthena will focus on self care including making healthy food choices, increasing physical activity and focusing on stress reduction.  - EKG 12-Lead - Hemoglobin A1c - Insulin, random - Lipid Panel With LDL/HDL Ratio - Vitamin B12  2. Shortness of breath on exertion Rebecca [name] does feel that  she gets out of breath more easily that she used to when she exercises. Amorina's shortness of breath appears to be obesity related and exercise induced. She has agreed to work on weight loss and gradually increase exercise to treat her exercise induced shortness of breath. Will continue to monitor  closely.  3. Other specified hypothyroidism Patient with long-standing hypothyroidism, on levothyroxine therapy. Rebecca [name] appears euthyroid. We will check labs today. Orders and follow up as documented in patient record.  - T3 - T4, free - TSH  4. Essential hypertension Rebecca [name] will start her Category 3 eating plan, and will continue working on healthy weight loss and exercise to improve blood pressure control. We will watch for signs of hypotension as she continues her lifestyle modifications. We will check labs today.  - Comprehensive metabolic panel - CBC with Differential/Platelet - Lipid Panel With LDL/HDL Ratio  5. Vitamin D deficiency Low Vitamin D level contributes to fatigue and are associated with obesity, breast, and colon cancer. We will check labs today. Rebecca [name] will follow-up for routine testing of Vitamin D, at least 2-3 times per year to avoid over-replacement.  - VITAMIN D 25 Hydroxy (Vit-D Deficiency, Fractures)  6. Other depression with emotional eating Behavior modification techniques were discussed today to help Rebecca [name] deal with her emotional/non-hunger eating behaviors. We will refer to Dr. Dewaine [name], our Bariatric Psychologist for evaluation. Orders and follow up as documented in patient record.   7. At risk for heart disease Rebecca [name] was given approximately 30 minutes of coronary artery disease prevention counseling today. She is 34 y.o. female and has risk factors for heart disease including obesity and hypertension. We discussed intensive lifestyle modifications today with an emphasis on specific weight loss instructions and strategies.   Repetitive spaced learning was employed today to elicit superior memory formation and behavioral change.  8. Class 3 severe obesity with serious comorbidity and body mass index (BMI) of 40.0 to 44.9 in adult, unspecified obesity type (HCC) Rebecca [name] is currently in the action stage of change and her goal is to continue with  weight loss efforts. I recommend Rebecca [name] begin the structured treatment plan as follows:  Rebecca [name] was given Levels of support handout today.  She has agreed to the Category 3 Plan.  Exercise goals: No exercise has been prescribed for now, while we concentrate on nutritional changes.   Behavioral modification strategies: increasing lean protein intake and dealing with family or coworker sabotage.  She was informed of the importance of frequent follow-up visits to maximize her success with intensive lifestyle modifications for her multiple health conditions. She was informed we would discuss her lab results at her next visit unless there is a critical issue that needs to be addressed sooner. Rebecca [name] agreed to keep her next visit at the agreed upon time to discuss these results.  Objective:   Blood pressure 106/72, pulse 69, temperature 97.7 F (36.5 C), temperature source Oral, height 5\' 5"  (1.651 m), weight 258 lb (117 kg), SpO2 97 %. Body mass index is 42.93 kg/m.  EKG: Normal sinus rhythm, rate 76 BPM.  Indirect Calorimeter completed today shows a VO2 of 309 and a REE of 2149.  Her calculated basal metabolic rate is 2150 thus her basal metabolic rate is better than expected.  General: Cooperative, alert, well developed, in no acute distress. HEENT: Conjunctivae and lids unremarkable. Cardiovascular: Regular rhythm.  Lungs: Normal work of breathing. Neurologic: No focal deficits.   Lab Results  Component Value Date   CREATININE 0.67 03/16/2020  BUN 11 03/16/2020   NA 138 03/16/2020   K 4.2 03/16/2020   CL 105 03/16/2020   CO2 19 (L) 03/16/2020   Lab Results  Component Value Date   ALT 17 03/16/2020   AST 11 03/16/2020   ALKPHOS 88 03/16/2020   BILITOT 0.3 03/16/2020   Lab Results  Component Value Date   HGBA1C 5.8 (H) 03/16/2020   Lab Results  Component Value Date   INSULIN 74.4 (H) 03/16/2020   Lab Results  Component Value Date   TSH 2.030 03/16/2020   Lab  Results  Component Value Date   CHOL 158 03/16/2020   HDL 35 (L) 03/16/2020   LDLCALC 90 03/16/2020   TRIG 193 (H) 03/16/2020   CHOLHDL 4.3 CALC 09/23/2008   Lab Results  Component Value Date   WBC 9.4 03/16/2020   HGB 13.0 03/16/2020   HCT 41.7 03/16/2020   MCV 84 03/16/2020   PLT 311 03/16/2020   No results found for: IRON, TIBC, FERRITIN  Attestation Statements:   Reviewed by clinician on day of visit: allergies, medications, problem list, medical history, surgical history, family history, social history, and previous encounter notes.   I, Burt Knack, am acting as transcriptionist for Quillian Quince, MD.  I have reviewed the above documentation for accuracy and completeness, and I agree with the above. - Quillian Quince, MD

## 2020-03-30 ENCOUNTER — Other Ambulatory Visit: Payer: Self-pay

## 2020-03-30 ENCOUNTER — Ambulatory Visit (INDEPENDENT_AMBULATORY_CARE_PROVIDER_SITE_OTHER): Payer: BC Managed Care – PPO | Admitting: Family Medicine

## 2020-03-30 ENCOUNTER — Encounter (INDEPENDENT_AMBULATORY_CARE_PROVIDER_SITE_OTHER): Payer: Self-pay | Admitting: Family Medicine

## 2020-03-30 VITALS — BP 121/79 | HR 82 | Temp 97.8°F | Ht 65.0 in | Wt 256.0 lb

## 2020-03-30 DIAGNOSIS — E559 Vitamin D deficiency, unspecified: Secondary | ICD-10-CM

## 2020-03-30 DIAGNOSIS — Z6841 Body Mass Index (BMI) 40.0 and over, adult: Secondary | ICD-10-CM

## 2020-03-30 DIAGNOSIS — R7303 Prediabetes: Secondary | ICD-10-CM

## 2020-03-30 DIAGNOSIS — E782 Mixed hyperlipidemia: Secondary | ICD-10-CM

## 2020-03-30 DIAGNOSIS — Z9189 Other specified personal risk factors, not elsewhere classified: Secondary | ICD-10-CM | POA: Diagnosis not present

## 2020-03-30 MED ORDER — METFORMIN HCL 500 MG PO TABS: 500.0000 mg | ORAL_TABLET | Freq: Every day | ORAL | 0 refills | Status: DC

## 2020-03-30 MED ORDER — VITAMIN D (ERGOCALCIFEROL) 1.25 MG (50000 UNIT) PO CAPS: 50000.0000 [IU] | ORAL_CAPSULE | ORAL | 0 refills | Status: DC

## 2020-03-30 NOTE — Progress Notes (Signed)
Chief Complaint:   OBESITY Rebecca [name] is here to discuss her progress with her obesity treatment plan along with follow-up of her obesity related diagnoses. Rebecca [name] is on the Category 3 Plan and states she is following her eating plan approximately 10% of the time. Rebecca [name] states she is doing 0 minutes 0 times per week.  Today's visit was #: 2 Starting weight: 258 lbs Starting date: 03/16/2020 Today's weight: 256 lbs Today's date: 03/30/2020 Total lbs lost to date: 2 Total lbs lost since last in-office visit: 2  Interim History: Rebecca [name] followed her plan well for about 4 days, and then weighed herself and her scale was up so she became discouraged and stopped her plan. She notes increased PM polyphagia and cravings.  Subjective:   1. Vitamin D deficiency Rebecca [name] is on OTC Vit D, but her level is not yet at goal. I discussed labs with the patient today.  2. Pre-diabetes Rebecca [name] has a new diagnosis of pre-diabetes. Her A1c is elevated at 5.8, and she is not on metformin. I discussed labs with the patient today.  3. Mixed hyperlipidemia Rebecca [name] has a new diagnosis of hyperlipidemia. Her HDL is low and triglycerides are elevated, which is consistent with metabolic syndrome. I discussed labs with the patient today.  4. At risk for diabetes mellitus Rebecca [name] is at higher than average risk for developing diabetes due to her obesity.   Assessment/Plan:   1. Vitamin D deficiency Low Vitamin D level contributes to fatigue and are associated with obesity, breast, and colon cancer. Rebecca [name] agreed to continue taking OTC Vit D 5,000 IU daily and start prescription Vitamin D 50,000 IU every week with no refills. She will follow-up for routine testing of Vitamin D, at least 2-3 times per year to avoid over-replacement. We will recheck labs in 3 months.  - Vitamin D, Ergocalciferol, (DRISDOL) 1.25 MG (50000 UNIT) CAPS capsule; Take 1 capsule (50,000 Units total) by mouth every 7 (seven)  days.  Dispense: 4 capsule; Refill: 0  2. Pre-diabetes Rebecca [name] will continue to work on weight loss, exercise, and decreasing simple carbohydrates to help decrease the risk of diabetes. Rebecca [name] agreed to start metformin 500 mg q daily with no refills. We will recheck labs in 3 months.  - metFORMIN (GLUCOPHAGE) 500 MG tablet; Take 1 tablet (500 mg total) by mouth daily with breakfast.  Dispense: 30 tablet; Refill: 0  3. Mixed hyperlipidemia Cardiovascular risk and specific lipid/LDL goals reviewed. We discussed several lifestyle modifications today. Rebecca [name] will continue to work on diet, exercise and weight loss efforts. We will recheck labs in 3 months. Orders and follow up as documented in patient record.   4. At risk for diabetes mellitus Rebecca [name] was given approximately 30 minutes of diabetes education and counseling today. We discussed intensive lifestyle modifications today with an emphasis on weight loss as well as increasing exercise and decreasing simple carbohydrates in her diet. We also reviewed medication options with an emphasis on risk versus benefit of those discussed.   Repetitive spaced learning was employed today to elicit superior memory formation and behavioral change.  5. Class 3 severe obesity with serious comorbidity and body mass index (BMI) of 40.0 to 44.9 in adult, unspecified obesity type (HCC) Rebecca [name] is currently in the action stage of change. As such, her goal is to continue with weight loss efforts. She has agreed to the Category 3 Plan.   Behavioral modification strategies: increasing lean protein intake and meal planning and cooking strategies.  Rebecca [name] has  agreed to follow-up with our clinic in 2 to 3 weeks. She was informed of the importance of frequent follow-up visits to maximize her success with intensive lifestyle modifications for her multiple health conditions.   Objective:   Blood pressure 121/79, pulse 82, temperature 97.8 F (36.6 C),  temperature source Oral, height 5\' 5"  (1.651 m), weight 256 lb (116.1 kg), SpO2 97 %. Body mass index is 42.6 kg/m.  General: Cooperative, alert, well developed, in no acute distress. HEENT: Conjunctivae and lids unremarkable. Cardiovascular: Regular rhythm.  Lungs: Normal work of breathing. Neurologic: No focal deficits.   Lab Results  Component Value Date   CREATININE 0.67 03/16/2020   BUN 11 03/16/2020   NA 138 03/16/2020   K 4.2 03/16/2020   CL 105 03/16/2020   CO2 19 (L) 03/16/2020   Lab Results  Component Value Date   ALT 17 03/16/2020   AST 11 03/16/2020   ALKPHOS 88 03/16/2020   BILITOT 0.3 03/16/2020   Lab Results  Component Value Date   HGBA1C 5.8 (H) 03/16/2020   Lab Results  Component Value Date   INSULIN 74.4 (H) 03/16/2020   Lab Results  Component Value Date   TSH 2.030 03/16/2020   Lab Results  Component Value Date   CHOL 158 03/16/2020   HDL 35 (L) 03/16/2020   LDLCALC 90 03/16/2020   TRIG 193 (H) 03/16/2020   CHOLHDL 4.3 CALC 09/23/2008   Lab Results  Component Value Date   WBC 9.4 03/16/2020   HGB 13.0 03/16/2020   HCT 41.7 03/16/2020   MCV 84 03/16/2020   PLT 311 03/16/2020   No results found for: IRON, TIBC, FERRITIN  Attestation Statements:   Reviewed by clinician on day of visit: allergies, medications, problem list, medical history, surgical history, family history, social history, and previous encounter notes.   I, 03/18/2020, am acting as transcriptionist for Burt Knack, MD.  I have reviewed the above documentation for accuracy and completeness, and I agree with the above. -  Quillian Quince, MD

## 2020-03-30 NOTE — Progress Notes (Signed)
121/79         

## 2020-04-01 ENCOUNTER — Encounter: Payer: Self-pay | Admitting: Family Medicine

## 2020-04-05 NOTE — Progress Notes (Signed)
Office: [phone]  /  Fax: [phone]    Date: April 07, 2020   Appointment Start Time: 8:59am Duration: 30 minutes Provider: Lawerance Cruel, Psy.D. Type of Session: Intake for Individual Therapy  Location of Patient: Home Location of Provider: Provider's Home Type of Contact: Telepsychological Visit via MyChart Video Visit  Informed Consent: Prior to proceeding with today's appointment, two pieces of identifying information were obtained. In addition, Davis's physical location at the time of this appointment was obtained as well a phone number she could be reached at in the event of technical difficulties. Conswella and this provider participated in today's telepsychological service.   The provider's role was explained to Chester C Vipond. The provider reviewed and discussed issues of confidentiality, privacy, and limits therein (e.g., reporting obligations). In addition to verbal informed consent, written informed consent for psychological services was obtained prior to the initial appointment. Since the clinic is not a 24/7 crisis center, mental health emergency resources were shared and this  provider explained MyChart, e-mail, voicemail, and/or other messaging systems should be utilized only for non-emergency reasons. This provider also explained that information obtained during appointments will be placed in Ethelyn's medical record and relevant information will be shared with other providers at [facility] for coordination of care. Moreover, Kenia agreed information may be shared with other [facility] providers as needed for coordination of care. By signing the service agreement document, Deniece provided written consent for coordination of care. Prior to initiating telepsychological services, Keon completed an informed consent document, which included the development of a safety plan (i.e., an emergency contact, nearest emergency room, and  emergency resources) in the event of an emergency/crisis. Collette expressed understanding of the rationale of the safety plan. Odelia verbally acknowledged understanding she is ultimately responsible for understanding her insurance benefits for telepsychological and in-person services. This provider also reviewed confidentiality, as it relates to telepsychological services, as well as the rationale for telepsychological services (i.e., to reduce exposure risk to COVID-19). Krista  acknowledged understanding that appointments cannot be recorded without both party consent and she is aware she is responsible for securing confidentiality on her end of the session. Favour verbally consented to proceed.  Chief Complaint/HPI: Khailee was referred by Dr. Quillian Quince due to other depression, with emotional eating. Per the note for the initial visit with Dr. Quillian Quince on March 16, 2020, "Marrian has a history of emotional eating and questionable family support." The note for the initial appointment with Dr. Quillian Quince indicated the following: "Nayelis's habits were reviewed today and are as follows: Her family eats meals together, her desired weight loss is 108-118 lbs, she started gaining weight about 3 years ago, her heaviest weight ever was 263 pounds, she has significant food cravings issues, she skips meals frequently, she is frequently drinking liquids with calories, she frequently makes poor food choices, she frequently eats larger portions than normal and she struggles with emotional eating." Lesha's Food and Mood (modified PHQ-9) score on March 17, 2020 was 17.  During today's appointment, Markiya was verbally administered a questionnaire assessing various behaviors related to emotional eating. Addalyn endorsed the following: overeat when you are celebrating, experience food cravings on a regular basis, find food is comforting to you, overeat when you are angry or upset, overeat when you are  worried about something, overeat frequently when you are bored or lonely and not worry about what you eat when you are in a good mood. She shared she craves sweets,  adding she goes out to eat frequently. Kailiana believes the onset of emotional eating was likely "over the past three years." She stated she often eats two meals a day and engages in emotional eating behaviors "4-5 times a week." Notably, she stated she often skips breakfast. In addition, Nakeia denied a history of binge eating. Daleigh denied a history of restricting food intake, purging and engagement in other compensatory strategies, and has never been diagnosed with an eating disorder. She also denied a history of treatment for emotional eating. Moreover, Nyaja indicated stress, boredom, or having a bad day triggers emotional eating, whereas staying busy makes emotional eating better. Furthermore, Nejla denied other problems of concern.    Mental Status Examination:  Appearance: well groomed and appropriate hygiene  Behavior: appropriate to circumstances Mood: euthymic Affect: mood congruent Speech: normal in rate, volume, and tone Eye Contact: appropriate Psychomotor Activity: appropriate Gait: unable to assess Thought Process: linear, logical, and goal directed  Thought Content/Perception: denies suicidal and homicidal ideation, plan, and intent and no hallucinations, delusions, bizarre thinking or behavior reported or observed Orientation: time, person, place, and purpose of appointment Memory/Concentration: memory, attention, language, and fund of knowledge intact  Insight/Judgment: fair  Family & Psychosocial History: Ricca reported she is married and she has two children (ages 36 and 85). 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 Additionally, Amrit shared her highest level of education obtained is an associate's degree. Currently, Labria's social support system consists of her  parents, husband, aunt, and friends. Moreover, Nakesha stated she resides with her husband and children.   Medical History:  Past Medical History:  Diagnosis Date  . GERD (gastroesophageal reflux disease)   . Hypertension   . Hypothyroidism   . Migraine   . Thyroid disease   . Tourette's syndrome   . Vitamin B 12 deficiency   . Vitamin D deficiency    Past Surgical History:  Procedure Laterality Date  . ABLATION     uterine  . CESAREAN SECTION    . CHOLECYSTECTOMY    . TONSILLECTOMY    . WISDOM TOOTH EXTRACTION     Current Outpatient Medications on File Prior to Visit  Medication Sig Dispense Refill  . Cholecalciferol (VITAMIN D) 125 MCG (5000 UT) CAPS Take 1 capsule by mouth daily.    . clonazePAM (KLONOPIN) 1 MG tablet Take 1 mg by mouth at bedtime as needed for anxiety.     . cyanocobalamin (,VITAMIN B-12,) 1000 MCG/ML injection Inject 1,000 mcg into the muscle every 30 (thirty) days.    [name] levothyroxine (SYNTHROID, LEVOTHROID) 75 MCG tablet Take 75 mcg by mouth daily before breakfast.     . metFORMIN (GLUCOPHAGE) 500 MG tablet Take 1 tablet (500 mg total) by mouth daily with breakfast. 30 tablet 0  . metoprolol succinate (TOPROL-XL) 100 MG 24 hr tablet Take 100 mg by mouth daily. Take with or immediately following a meal.    . omeprazole (PRILOSEC) 20 MG capsule omeprazole 20 mg cpdr    . pimozide (ORAP) 2 MG tablet Take 4 mg by mouth daily.     [name] topiramate (TOPAMAX) 100 MG tablet Take 100 mg by mouth at bedtime.    . valsartan (DIOVAN) 320 MG tablet Take 320 mg by mouth daily.    . Vitamin D, Ergocalciferol, (DRISDOL) 1.25 MG (50000 UNIT) CAPS capsule Take 1 capsule (50,000 Units total) by mouth every 7 (seven) days. 4 capsule 0   No current facility-administered medications on file  prior to visit.   Mental Health History: Osa reported she was diagnosed with Tourette's Syndrome and she meets with a psychiatrist, Meredith Staggers, M.D. She noted the frequency of  appointments as "once or twice a year" for medication management. Dr. Jennelle Human prescribes her psychotropic medications. She further shared she is "obsessive about things" at times and has a history of AD/HD related symptoms. Jeanette reported there is no history of hospitalizations for psychiatric concerns. Devanny believes her father "has Tourette's" and her mother is "a little looney." Tamelia reported there is no history of trauma including psychological, physical  and sexual abuse, as well as neglect.   Taunia described her typical mood lately as "fine." Aside from concerns noted above and endorsed on the PHQ-9, Sarahbeth reported experiencing crying spells and noted a history of panic attacks. She described the frequency as two times a year, noting her last panic attack was three months ago. Joetta explained Klonopin helps her cope; however, discussed occassions where she feels as though she sees someone in the room after she takes Klonopin. She was encouraged to inform Dr. Jennelle Human; she agreed. Theodore denied current alcohol use. She denied tobacco use. She denied illicit/recreational substance use. Regarding caffeine intake, Aubriana reported consuming 1-2 glasses of sweet tea daily. Furthermore, Naylah indicated she is not experiencing the following: hallucinations and delusions, paranoia, symptoms of mania , social withdrawal and decreased motivation. She also denied history of and current suicidal ideation, plan, and intent; history of and current homicidal ideation, plan, and intent; and history of and current engagement in self-harm.  The following strengths were reported by Aashvi: good mom, good at keeping up with house work, and go to work every day. The following strengths were observed by this provider: ability to express thoughts and feelings during the therapeutic session, ability to establish and benefit from a therapeutic relationship, willingness to work toward established goal(s)  with the clinic and ability to engage in reciprocal conversation.   Legal History: Lyana reported there is no history of legal involvement.   Structured Assessments Results: The Patient Health Questionnaire-9 (PHQ-9) is a self-report measure that assesses symptoms and severity of depression over the course of the last two weeks. Janazia obtained a score of 3 suggesting minimal depression. Trena finds the endorsed symptoms to be somewhat difficult. [0= Not at all; 1= Several days; 2= More than half the days; 3= Nearly every day] Little interest or pleasure in doing things 0  Feeling down, depressed, or hopeless 0  Trouble falling or staying asleep, or sleeping too much 0  Feeling tired or having little energy 3  Poor appetite or overeating 0  Feeling bad about yourself --- or that you are a failure or have let yourself or your family down 0  Trouble concentrating on things, such as reading the newspaper or watching television 0  Moving or speaking so slowly that other people could have noticed? Or the opposite --- being so fidgety or restless that you have been moving around a lot more than usual 0  Thoughts that you would be better off dead or hurting yourself in some way 0  PHQ-9 Score 3    The Generalized Anxiety Disorder-7 (GAD-7) is a brief self-report measure that assesses symptoms of anxiety over the course of the last two weeks. [employer] obtained a score of 0. [0= Not at all; 1= Several days; 2= Over half the days; 3= Nearly every day] Feeling nervous, anxious, on edge 0  Not being able to stop  or control worrying 0  Worrying too much about different things 0  Trouble relaxing 0  Being so restless that it's hard to sit still 0  Becoming easily annoyed or irritable 0  Feeling afraid as if something awful might happen 0  GAD-7 Score 0   Interventions:  Conducted a chart review Focused on rapport building Verbally administered PHQ-9 and GAD-7 for symptom monitoring Verbally  administered Food & Mood questionnaire to assess various behaviors related to emotional eating Provided emphatic reflections and validation Collaborated with patient on a treatment goal  Psychoeducation provided regarding physical versus emotional hunger  Provisional DSM-5 Diagnosis(es): 307.59 (F50.8) Other Specified Feeding or Eating Disorder, Emotional Eating Behaviors  Plan: Kaylianna appears able and willing to participate as evidenced by collaboration on a treatment goal, engagement in reciprocal conversation, and asking questions as needed for clarification. The following treatment goal was established: increase coping skills. This provider will regularly review the treatment plan and medical chart to keep informed of status changes.  Yarely indicated she will be losing her insurance as she is switching jobs. She explained a plan to be added to her husband's insurance as her new job stated it would be 90 days before she has insurance through them. She was encouraged to call the clinic to schedule a follow-up appointment once she has insurance again. Notably, this provider disclosed she will be going on maternity leave in the coming months and in the event that Dakotah is unable to be added to [employer]'s insurance, she was receptive to discussing referral options for therapeutic services. Sincerity will be sent a handout via e-mail to utilize between now and the next appointment to increase awareness of hunger patterns and subsequent eating. Florette provided verbal consent during today's appointment for this provider to send the handout via e-mail.

## 2020-04-07 ENCOUNTER — Telehealth (INDEPENDENT_AMBULATORY_CARE_PROVIDER_SITE_OTHER): Payer: BC Managed Care – PPO | Admitting: Psychology

## 2020-04-07 ENCOUNTER — Other Ambulatory Visit: Payer: Self-pay

## 2020-04-07 DIAGNOSIS — F5089 Other specified eating disorder: Secondary | ICD-10-CM | POA: Diagnosis not present

## 2020-04-12 ENCOUNTER — Ambulatory Visit: Payer: BC Managed Care – PPO | Admitting: Cardiology

## 2020-04-19 ENCOUNTER — Ambulatory Visit (INDEPENDENT_AMBULATORY_CARE_PROVIDER_SITE_OTHER): Payer: BC Managed Care – PPO | Admitting: Psychology

## 2020-04-20 ENCOUNTER — Ambulatory Visit (INDEPENDENT_AMBULATORY_CARE_PROVIDER_SITE_OTHER): Payer: BC Managed Care – PPO | Admitting: Family Medicine

## 2020-04-21 ENCOUNTER — Encounter: Payer: Self-pay | Admitting: Psychiatry

## 2020-04-21 ENCOUNTER — Other Ambulatory Visit: Payer: Self-pay

## 2020-04-21 ENCOUNTER — Ambulatory Visit (INDEPENDENT_AMBULATORY_CARE_PROVIDER_SITE_OTHER): Payer: Self-pay | Admitting: Psychiatry

## 2020-04-21 DIAGNOSIS — F4001 Agoraphobia with panic disorder: Secondary | ICD-10-CM

## 2020-04-21 DIAGNOSIS — F5105 Insomnia due to other mental disorder: Secondary | ICD-10-CM

## 2020-04-21 MED ORDER — CLONAZEPAM 1 MG PO TABS: 1.0000 mg | ORAL_TABLET | Freq: Every evening | ORAL | 0 refills | Status: DC | PRN

## 2020-04-21 NOTE — Progress Notes (Signed)
Rebecca [name] [id number] December 14, 1985 34 y.o.  Subjective:   Patient ID:  Rebecca [name] is a 34 y.o. (DOB 1986-01-04) female.  Chief Complaint:  Chief Complaint  Patient presents with  . Follow-up    Medication Management  . Other    Tourettes  . Sleeping Problem    Medication Refill Associated symptoms include headaches. Pertinent negatives include no fatigue or weakness.   Rebecca [name] presents to the office today for follow-up of tics and panic disorder.  Last seen in September.  Panic was bad and citalopram was added with increase in clonazepam to 1.5 mg HS.  She never took citalopram bc fear of heart issues.  She placed a phone call here after the visit and was reassured but still did not take the medicine.  Last seen In December 2020.  The followiing was discussed: Disc phentermine and weight loss.  "I'm desperate to lose weight."   Okay to start phentermine 37.5 mg tablets one half every morning for 7 days then 1 every morning.  #30 and 1 refill sent in. Continue Orap 4 mg 1 tablet daily.    04/21/20 appt withe the following noted: Never took the phentermine bc of fear of BP effects. Only takes clonazepam for sleep a few times per month. On topiramate 100 mg is max dose for HA.    Getting a new job.  Not changing bc of tics.  Has not been losing jobs because of tics lately.  The same, fine with tics.  It doesn't change re: tics.  I learn to live with it.  No complaints lately with boss at work about tics.  Tics will worsen if nervous or upset or around crowds.  Here and there has anxiety.  Long drive to work and worked up when she gets home from work and deal with kids at night.  Poor sleep.  Restless sleep and up 1/2 the night.    Not really taking clonazepam 1 mg much bc inconsistent effects on her sleep.  Tries not to drink in evening.  Tries to keep it in the morning.  Avoids after lunch.  Eats dinner early.  Both initial and terminal insomnia and averages ?  Amount of sleep.  Tired in the day but stays awake.    BP is up some DT weight.   Disc Ozempic  .  No panic now at all.  No clonazepam except rare.  Never during the day.  Patient reports stable mood and denies depressed or irritable moods.  Patient denies any recent difficulty with anxiety.  Sleep issues and has talked to PCP about probable sleep apnea. Denies appetite disturbance.  Patient reports that energy and motivation have been good.  Patient denies any difficulty with concentration.  Patient denies any suicidal ideation.  She tried to reduce Orap from 4 to 2 mg for a week but the Tourette's got worse and she increased it back to 4 mg.  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  Bosses not complaining at this time.  Manageable on the 4 mg.  Wasn't sure if decrease helped tiredness.  Has lost jobs DT tics.  At the same job since January.  Good so far.  At [facility] clinic.  Enjoys geriatrics.    Past Psychiatric Medication Trials: Orap since high school, Depakote cog SE, Trileptal worsened tics, clonidine remote,  sertraline, topiramate,  history of ADD meds clonazepam 1,  Review of Systems:  Review of Systems  Constitutional: Negative for fatigue.  Neurological: Positive for headaches. Negative for tremors and weakness.       Tics    Medications: I have reviewed the patient's current medications.  Current Outpatient Medications  Medication Sig Dispense Refill  . Cholecalciferol (VITAMIN D) 125 MCG (5000 UT) CAPS Take 1 capsule by mouth daily.    . clonazePAM (KLONOPIN) 1 MG tablet Take 1 tablet (1 mg total) by mouth at bedtime as needed for anxiety. 90 tablet 0  . cyanocobalamin (,VITAMIN B-12,) 1000 MCG/ML injection Inject 1,000 mcg into the muscle every 30 (thirty) days.    [name] levothyroxine (SYNTHROID, LEVOTHROID) 75 MCG tablet Take 75 mcg by mouth daily before breakfast.     . metoprolol succinate  (TOPROL-XL) 100 MG 24 hr tablet Take 100 mg by mouth daily. Take with or immediately following a meal.    . omeprazole (PRILOSEC) 20 MG capsule omeprazole 20 mg cpdr    . pimozide (ORAP) 2 MG tablet Take 4 mg by mouth daily.     [name] topiramate (TOPAMAX) 100 MG tablet Take 100 mg by mouth at bedtime.    . valsartan (DIOVAN) 320 MG tablet Take 320 mg by mouth daily.    . Vitamin D, Ergocalciferol, (DRISDOL) 1.25 MG (50000 UNIT) CAPS capsule Take 1 capsule (50,000 Units total) by mouth every 7 (seven) days. 4 capsule 0   No current facility-administered medications for this visit.    Medication Side Effects: Fatigue and Other: tired  Allergies:  Allergies  Allergen Reactions  . Diphenhydramine Hcl     REACTION: interacts with orap  . Promethazine Hcl     REACTION: interacts with orap  . Pseudoephedrine     REACTION: colitis  . Sulfonamide Derivatives     REACTION: ? rash  . Zithromax [Azithromycin]     Interacts with orap that patient is taking    Past Medical History:  Diagnosis Date  . GERD (gastroesophageal reflux disease)   . Hypertension   . Hypothyroidism   . Migraine   . Thyroid disease   . Tourette's syndrome   . Vitamin B 12 deficiency   . Vitamin D deficiency     Family History  Problem Relation Age of Onset  . Heart disease Father   . Hypertension Father   . Heart attack Father   . Asthma Son   . ADD / ADHD Son   . Hypertension Paternal Grandmother   . Cancer Paternal Grandfather   . Headache Mother   . Hyperlipidemia Mother     Social History   Socioeconomic History  . Marital status: Married    Spouse name: Rebecca [name]  . Number of children: 2  . Years of education: associates  . Highest education level: Not on file  Occupational History  . Occupation: CMA  Tobacco Use  . Smoking status: Never Smoker  . Smokeless tobacco: Never Used  Substance and Sexual Activity  . Alcohol use: Never  . Drug use: Never  . Sexual activity: Yes    Birth  control/protection: Other-see comments    Comment: husband vasectomy  Other Topics Concern  . Not on file  Social History Narrative   Lives at home with husband and 2 children   Left handed   Caffeine: 1-2 cups/day   Social Determinants of Health   Financial Resource Strain:   . Difficulty of Paying Living Expenses: Not on file  Food Insecurity:   . Worried About  Running Out of Food in the Last Year: Not on file  . Ran Out of Food in the Last Year: Not on file  Transportation Needs:   . Lack of Transportation (Medical): Not on file  . Lack of Transportation (Non-Medical): Not on file  Physical Activity:   . Days of Exercise per Week: Not on file  . Minutes of Exercise per Session: Not on file  Stress:   . Feeling of Stress : Not on file  Social Connections:   . Frequency of Communication with Friends and Family: Not on file  . Frequency of Social Gatherings with Friends and Family: Not on file  . Attends Religious Services: Not on file  . Active Member of Clubs or Organizations: Not on file  . Attends Banker Meetings: Not on file  . Marital Status: Not on file  Intimate Partner Violence:   . Fear of Current or Ex-Partner: Not on file  . Emotionally Abused: Not on file  . Physically Abused: Not on file  . Sexually Abused: Not on file    Past Medical History, Surgical history, Social history, and Family history were reviewed and updated as appropriate.   Please see review of systems for further details on the patient's review from today.   Objective:   Physical Exam:  There were no vitals taken for this visit.  Physical Exam Constitutional:      General: She is not in acute distress.    Appearance: She is well-developed. She is obese.  Musculoskeletal:        General: No deformity.  Neurological:     Mental Status: She is alert and oriented to person, place, and time.     Coordination: Coordination normal.     Comments: Tics not seen in office today   Psychiatric:        Attention and Perception: Attention and perception normal. She does not perceive auditory or visual hallucinations.        Mood and Affect: Mood normal. Mood is not anxious or depressed. Affect is not labile, blunt, angry or inappropriate.        Speech: Speech normal.        Behavior: Behavior normal.        Thought Content: Thought content normal. Thought content is not paranoid or delusional. Thought content does not include homicidal or suicidal ideation. Thought content does not include homicidal or suicidal plan.        Cognition and Memory: Cognition and memory normal.        Judgment: Judgment normal.     Comments: Insight fair.  General reservations about meds. No tics in the office noted     Lab Review:     Component Value Date/Time   NA 138 03/16/2020 1433   K 4.2 03/16/2020 1433   CL 105 03/16/2020 1433   CO2 19 (L) 03/16/2020 1433   GLUCOSE 85 03/16/2020 1433   GLUCOSE 86 04/02/2013 0115   BUN 11 03/16/2020 1433   CREATININE 0.67 03/16/2020 1433   CALCIUM 9.1 03/16/2020 1433   PROT 6.3 03/16/2020 1433   ALBUMIN 4.0 03/16/2020 1433   AST 11 03/16/2020 1433   ALT 17 03/16/2020 1433   ALKPHOS 88 03/16/2020 1433   BILITOT 0.3 03/16/2020 1433   GFRNONAA 113 03/16/2020 1433   GFRAA 131 03/16/2020 1433       Component Value Date/Time   WBC 9.4 03/16/2020 1433   WBC 11.1 (H) 04/02/2013 0115   RBC 4.95  03/16/2020 1433   RBC 4.34 04/02/2013 0115   HGB 13.0 03/16/2020 1433   HCT 41.7 03/16/2020 1433   PLT 311 03/16/2020 1433   MCV 84 03/16/2020 1433   MCH 26.3 (L) 03/16/2020 1433   MCH 30.0 04/02/2013 0115   MCHC 31.2 (L) 03/16/2020 1433   MCHC 34.8 04/02/2013 0115   RDW 14.9 03/16/2020 1433   LYMPHSABS 2.9 03/16/2020 1433   MONOABS 0.7 04/02/2013 0115   EOSABS 0.1 03/16/2020 1433   BASOSABS 0.1 03/16/2020 1433    No results found for: POCLITH, LITHIUM   No results found for: PHENYTOIN, PHENOBARB, VALPROATE, CBMZ   .res Assessment:  Plan:    Aaliah was seen today for follow-up, other and sleeping problem.  Diagnoses and all orders for this visit:  Panic disorder with agoraphobia -     clonazePAM (KLONOPIN) 1 MG tablet; Take 1 tablet (1 mg total) by mouth at bedtime as needed for anxiety.  Insomnia due to mental condition -     clonazePAM (KLONOPIN) 1 MG tablet; Take 1 tablet (1 mg total) by mouth at bedtime as needed for anxiety.  Obesity Likely obstructive sleep apnea  Patient has a long history of Tourette syndrome, ADD, and panic disorder.  She has lost a number of jobs over the years due to her Tourette's.  When the Tourette's is bad it causes lots of panic symptoms as well.  She has tried 6 mg of Orap in the past but does not like the side effects.  She is also generally reluctant to take higher dosages even though she still has significant symptoms.  She is also reluctant to try new medications.   She is somewhat fearful about medications and that does complicate her treatment somewhat.  However the last 9-12 months she is been relatively stable more so than usual, therefore it is not necessary to initiate a med change today.  Based on past history she is likely to have a relapse and exacerbation of Tourette's and panic again in the future.  At that time besides increasing Orap other considerations could be clonidine or increasing clonazepam.  I am hesitant to push her to start clonazepam at this time because of the probable sleep apnea discussed below.  Benzodiazepines can worsen sleep apnea.  Disc weight loss and Ozempic to discuss with her weight loss doc when she returns for insurance reasons in December. Disc weight loss effects and SE of topirmate and off label use for tics.  Continue Orap 4 mg 1 tablet daily. Has to get Orap from [place] DT expense.  She is prescribed Orap 4 mg tablets 1 daily #100 with 2 refills.  Orap 4 mg tablet size is not an option in epic.  Prescription had to be handwritten.  Discussed  potential metabolic side effects associated with atypical antipsychotics, as well as potential risk for movement side effects. Advised pt to contact office if movement side effects occur.   She uses clonazepam sparingly.  This is sometimes used as a secondary treatment for Tourette syndrome as well. We discussed the short-term risks associated with benzodiazepines including sedation and increased fall risk among others.  Discussed long-term side effect risk including dependence, potential withdrawal symptoms, and the potential eventual dose-related risk of dementia.  But recent studies from 2020 dispute this association between benzodiazepines and dementia risk. Newer studies in 2020 do not support an association with dementia.  FU 9 mos DT cost and pt has been more stable in last couple of years.  Meredith Staggers, MD, DFAPA   Please see After Visit Summary for patient specific instructions.  No future appointments.  No orders of the defined types were placed in this encounter.   -------------------------------

## 2020-04-22 ENCOUNTER — Ambulatory Visit: Payer: BC Managed Care – PPO | Admitting: Psychiatry

## 2020-08-30 DIAGNOSIS — B078 Other viral warts: Secondary | ICD-10-CM | POA: Diagnosis not present

## 2020-08-30 DIAGNOSIS — D485 Neoplasm of uncertain behavior of skin: Secondary | ICD-10-CM | POA: Diagnosis not present

## 2020-08-30 DIAGNOSIS — L720 Epidermal cyst: Secondary | ICD-10-CM | POA: Diagnosis not present

## 2020-08-30 DIAGNOSIS — L821 Other seborrheic keratosis: Secondary | ICD-10-CM | POA: Diagnosis not present

## 2020-10-14 DIAGNOSIS — Z0000 Encounter for general adult medical examination without abnormal findings: Secondary | ICD-10-CM | POA: Diagnosis not present

## 2020-10-14 DIAGNOSIS — E039 Hypothyroidism, unspecified: Secondary | ICD-10-CM | POA: Diagnosis not present

## 2020-10-14 DIAGNOSIS — E538 Deficiency of other specified B group vitamins: Secondary | ICD-10-CM | POA: Diagnosis not present

## 2020-10-14 DIAGNOSIS — E559 Vitamin D deficiency, unspecified: Secondary | ICD-10-CM | POA: Diagnosis not present

## 2020-10-14 DIAGNOSIS — I10 Essential (primary) hypertension: Secondary | ICD-10-CM | POA: Diagnosis not present

## 2020-10-14 DIAGNOSIS — G43001 Migraine without aura, not intractable, with status migrainosus: Secondary | ICD-10-CM | POA: Diagnosis not present

## 2020-11-02 DIAGNOSIS — Z130 Encounter for screening for diseases of the blood and blood-forming organs and certain disorders involving the immune mechanism: Secondary | ICD-10-CM | POA: Diagnosis not present

## 2020-11-02 DIAGNOSIS — Z1389 Encounter for screening for other disorder: Secondary | ICD-10-CM | POA: Diagnosis not present

## 2020-11-02 DIAGNOSIS — Z6841 Body Mass Index (BMI) 40.0 and over, adult: Secondary | ICD-10-CM | POA: Diagnosis not present

## 2020-11-02 DIAGNOSIS — Z01419 Encounter for gynecological examination (general) (routine) without abnormal findings: Secondary | ICD-10-CM | POA: Diagnosis not present

## 2020-12-22 DIAGNOSIS — R309 Painful micturition, unspecified: Secondary | ICD-10-CM | POA: Diagnosis not present

## 2021-02-22 ENCOUNTER — Ambulatory Visit: Payer: Self-pay | Admitting: Psychiatry

## 2021-04-01 DIAGNOSIS — L738 Other specified follicular disorders: Secondary | ICD-10-CM | POA: Diagnosis not present

## 2021-04-01 DIAGNOSIS — L918 Other hypertrophic disorders of the skin: Secondary | ICD-10-CM | POA: Diagnosis not present

## 2021-04-01 DIAGNOSIS — D485 Neoplasm of uncertain behavior of skin: Secondary | ICD-10-CM | POA: Diagnosis not present

## 2021-04-01 DIAGNOSIS — D2239 Melanocytic nevi of other parts of face: Secondary | ICD-10-CM | POA: Diagnosis not present

## 2021-04-18 ENCOUNTER — Encounter: Payer: Self-pay | Admitting: Psychiatry

## 2021-04-18 ENCOUNTER — Other Ambulatory Visit: Payer: Self-pay

## 2021-04-18 ENCOUNTER — Ambulatory Visit (INDEPENDENT_AMBULATORY_CARE_PROVIDER_SITE_OTHER): Payer: BC Managed Care – PPO | Admitting: Psychiatry

## 2021-04-18 DIAGNOSIS — F902 Attention-deficit hyperactivity disorder, combined type: Secondary | ICD-10-CM | POA: Diagnosis not present

## 2021-04-18 DIAGNOSIS — F4001 Agoraphobia with panic disorder: Secondary | ICD-10-CM

## 2021-04-18 DIAGNOSIS — F5105 Insomnia due to other mental disorder: Secondary | ICD-10-CM

## 2021-04-18 DIAGNOSIS — F952 Tourette's disorder: Secondary | ICD-10-CM | POA: Diagnosis not present

## 2021-04-18 NOTE — Progress Notes (Signed)
Rebecca [name] [id number] 06/10/1986 35 y.o.  Subjective:   Patient ID:  Rebecca [name] is a 35 y.o. (DOB 08-Oct-1985) female.  Chief Complaint:  Chief Complaint  Patient presents with   Follow-up   Tourette Syndrome   Anxiety    Medication Refill Associated symptoms include headaches. Pertinent negatives include no fatigue or weakness.  Rebecca [name] presents to the office today for follow-up of tics and panic disorder.  seen in September.  Panic was bad and citalopram was added with increase in clonazepam to 1.5 mg HS.  She never took citalopram bc fear of heart issues.  She placed a phone call here after the visit and was reassured but still did not take the medicine.  seen In December 2020.  The followiing was discussed: Disc phentermine and weight loss.  "I'm desperate to lose weight."   Okay to start phentermine 37.5 mg tablets one half every morning for 7 days then 1 every morning.  #30 and 1 refill sent in. Continue Orap 4 mg 1 tablet daily.    04/21/20 appt withe the following noted: Never took the phentermine bc of fear of BP effects. Only takes clonazepam for sleep a few times per month. On topiramate 100 mg is max dose for HA.    Getting a new job.  Not changing bc of tics.  Has not been losing jobs because of tics lately.  The same, fine with tics.  It doesn't change re: tics.  I learn to live with it.  No complaints lately with boss at work about tics.  Tics will worsen if nervous or upset or around crowds.  Here and there has anxiety.  Long drive to work and worked up when she gets home from work and deal with kids at night.  Poor sleep.  Restless sleep and up 1/2 the night.    Not really taking clonazepam 1 mg much bc inconsistent effects on her sleep.  Tries not to drink in evening.  Tries to keep it in the morning.  Avoids after lunch.  Eats dinner early.  Both initial and terminal insomnia and averages ? Amount of sleep.  Tired in the day but stays awake.     [name]  No panic now at all.  No clonazepam except rare.  Never during the day.  Patient reports stable mood and denies depressed or irritable moods.  Patient denies any recent difficulty with anxiety.  Sleep issues and has talked to PCP about probable sleep apnea. Denies appetite disturbance.  Patient reports that energy and motivation have been good.  Patient denies any difficulty with concentration.  Patient denies any suicidal ideation.  She tried to reduce Orap from 4 to 2 mg for a week but the Tourette's got worse and she increased it back to 4 mg.  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  Bosses not complaining at this time.  Manageable on the 4 mg.  Wasn't sure if decrease helped tiredness.  04/18/21 appt noted: Tourette's the same and hasn't lost jobs.  Hasn't had problems at this job. Been there 18 mos. Patient reports stable mood and denies depressed or irritable moods.  Patient denies any recent difficulty with anxiety.  Patient denies difficulty with sleep initiation or maintenance. Denies appetite disturbance.  Patient reports that energy and motivation have been good.  Patient denies any difficulty with concentration.  Patient denies any suicidal ideation. Rare use of  clonazepam. Orap in [place] is still  very expensive. No SE except hungry.  Has lost jobs DT tics.  At the same job since January.  Good so far.  At [facility].  Enjoys geriatrics.    Past Psychiatric Medication Trials: Orap since high school, Depakote cog SE, Trileptal worsened tics, clonidine remote,  sertraline, topiramate,  history of ADD meds clonazepam 1,  Review of Systems:  Review of Systems  Constitutional:  Positive for unexpected weight change. Negative for fatigue.  Neurological:  Positive for headaches. Negative for tremors and weakness.       Tics   Medications: I have reviewed the patient's current medications.  Current  Outpatient Medications  Medication Sig Dispense Refill   Cholecalciferol (VITAMIN D) 125 MCG (5000 UT) CAPS Take 1 capsule by mouth daily.     clonazePAM (KLONOPIN) 1 MG tablet Take 1 tablet (1 mg total) by mouth at bedtime as needed for anxiety. 90 tablet 0   levothyroxine (SYNTHROID, LEVOTHROID) 75 MCG tablet Take 75 mcg by mouth daily before breakfast.      metoprolol succinate (TOPROL-XL) 100 MG 24 hr tablet Take 100 mg by mouth daily. Take with or immediately following a meal.     omeprazole (PRILOSEC) 20 MG capsule omeprazole 20 mg cpdr     pimozide (ORAP) 2 MG tablet Take 4 mg by mouth daily.      topiramate (TOPAMAX) 100 MG tablet Take 100 mg by mouth at bedtime.     valsartan (DIOVAN) 320 MG tablet Take 320 mg by mouth daily. With htcz     Vitamin D, Ergocalciferol, (DRISDOL) 1.25 MG (50000 UNIT) CAPS capsule Take 1 capsule (50,000 Units total) by mouth every 7 (seven) days. 4 capsule 0   cyanocobalamin (,VITAMIN B-12,) 1000 MCG/ML injection Inject 1,000 mcg into the muscle every 30 (thirty) days. (Patient not taking: Reported on 04/18/2021)     No current facility-administered medications for this visit.    Medication Side Effects: Fatigue and Other: tired  Allergies:  Allergies  Allergen Reactions   Diphenhydramine Hcl     REACTION: interacts with orap   Promethazine Hcl     REACTION: interacts with orap   Pseudoephedrine     REACTION: colitis   Sulfonamide Derivatives     REACTION: ? rash   Zithromax [Azithromycin]     Interacts with orap that patient is taking    Past Medical History:  Diagnosis Date   GERD (gastroesophageal reflux disease)    Hypertension    Hypothyroidism    Migraine    Thyroid disease    Tourette's syndrome    Vitamin B 12 deficiency    Vitamin D deficiency     Family History  Problem Relation Age of Onset   Heart disease Father    Hypertension Father    Heart attack Father    Asthma Son    ADD / ADHD Son    Hypertension Paternal  Grandmother    Cancer Paternal Grandfather    Headache Mother    Hyperlipidemia Mother     Social History   Socioeconomic History   Marital status: Married    Spouse name: Ferol Laiche   Number of children: 2   Years of education: associates   Highest education level: Not on file  Occupational History   Occupation: CMA  Tobacco Use   Smoking status: Never   Smokeless tobacco: Never  Substance and Sexual Activity   Alcohol use: Never   Drug use: Never  Sexual activity: Yes    Birth control/protection: Other-see comments    Comment: husband vasectomy  Other Topics Concern   Not on file  Social History Narrative   Lives at home with husband and 2 children   Left handed   Caffeine: 1-2 cups/day   Social Determinants of Health   Financial Resource Strain: Not on file  Food Insecurity: Not on file  Transportation Needs: Not on file  Physical Activity: Not on file  Stress: Not on file  Social Connections: Not on file  Intimate Partner Violence: Not on file    Past Medical History, Surgical history, Social history, and Family history were reviewed and updated as appropriate.   Please see review of systems for further details on the patient's review from today.   Objective:   Physical Exam:  There were no vitals taken for this visit.  Physical Exam Constitutional:      General: She is not in acute distress.    Appearance: She is well-developed. She is obese.  Musculoskeletal:        General: No deformity.  Neurological:     Mental Status: She is alert and oriented to person, place, and time.     Coordination: Coordination normal.     Comments: Tics not seen in office today  Psychiatric:        Attention and Perception: Attention and perception normal. She does not perceive auditory or visual hallucinations.        Mood and Affect: Mood normal. Mood is not anxious or depressed. Affect is not labile, blunt, angry or inappropriate.        Speech: Speech normal.         Behavior: Behavior normal.        Thought Content: Thought content normal. Thought content is not paranoid or delusional. Thought content does not include homicidal or suicidal ideation. Thought content does not include homicidal or suicidal plan.        Cognition and Memory: Cognition and memory normal.        Judgment: Judgment normal.     Comments: Insight fair.  Anxiety minimal No tics in the office noted    Lab Review:     Component Value Date/Time   NA 138 03/16/2020 1433   K 4.2 03/16/2020 1433   CL 105 03/16/2020 1433   CO2 19 (L) 03/16/2020 1433   GLUCOSE 85 03/16/2020 1433   GLUCOSE 86 04/02/2013 0115   BUN 11 03/16/2020 1433   CREATININE 0.67 03/16/2020 1433   CALCIUM 9.1 03/16/2020 1433   PROT 6.3 03/16/2020 1433   ALBUMIN 4.0 03/16/2020 1433   AST 11 03/16/2020 1433   ALT 17 03/16/2020 1433   ALKPHOS 88 03/16/2020 1433   BILITOT 0.3 03/16/2020 1433   GFRNONAA 113 03/16/2020 1433   GFRAA 131 03/16/2020 1433       Component Value Date/Time   WBC 9.4 03/16/2020 1433   WBC 11.1 (H) 04/02/2013 0115   RBC 4.95 03/16/2020 1433   RBC 4.34 04/02/2013 0115   HGB 13.0 03/16/2020 1433   HCT 41.7 03/16/2020 1433   PLT 311 03/16/2020 1433   MCV 84 03/16/2020 1433   MCH 26.3 (L) 03/16/2020 1433   MCH 30.0 04/02/2013 0115   MCHC 31.2 (L) 03/16/2020 1433   MCHC 34.8 04/02/2013 0115   RDW 14.9 03/16/2020 1433   LYMPHSABS 2.9 03/16/2020 1433   MONOABS 0.7 04/02/2013 0115   EOSABS 0.1 03/16/2020 1433   BASOSABS 0.1 03/16/2020 1433  No results found for: POCLITH, LITHIUM   No results found for: PHENYTOIN, PHENOBARB, VALPROATE, CBMZ   .res Assessment: Plan:    Lakesa was seen today for follow-up, tourette syndrome and anxiety.  Diagnoses and all orders for this visit:  Panic disorder with agoraphobia  Tourette syndrome  Attention deficit hyperactivity disorder (ADHD), combined type  Insomnia due to mental condition Obesity Likely obstructive  sleep apnea  Patient has a long history of Tourette syndrome, ADD, and panic disorder.  She has lost a number of jobs over the years due to her Tourette's.  When the Tourette's is bad it causes lots of panic symptoms as well.  She has tried 6 mg of Orap in the past but does not like the side effects.  She is also generally reluctant to take higher dosages even though she still has significant symptoms.  She is also reluctant to try new medications. the last 24 months she is been relatively stable more so than usual, therefore it is not necessary to initiate a med change today.  Based on past history she is likely to have a relapse and exacerbation of Tourette's and panic again in the future.  At that time besides increasing Orap other considerations could be clonidine or increasing clonazepam.  I am hesitant to push her to start clonazepam at this time because of the probable sleep apnea discussed below.  Benzodiazepines can worsen sleep apnea.  Disc weight loss and Ozempic to discuss with her PCP when availble. Disc weight loss effects and SE of topirmate and off label use for tics.  Continue Orap 4 mg 1 tablet daily. Has to get Orap from [place] DT expense.  She is prescribed Orap 4 mg tablets 1 daily #400, no refills.  Orap 4 mg tablet size is not an option in epic.  Prescription had to be handwritten.  Discussed potential metabolic side effects associated with atypical antipsychotics, as well as potential risk for movement side effects. Advised pt to contact office if movement side effects occur.   She uses clonazepam sparingly.  This is sometimes used as a secondary treatment for Tourette syndrome as well. We discussed the short-term risks associated with benzodiazepines including sedation and increased fall risk among others.  Discussed long-term side effect risk including dependence, potential withdrawal symptoms, and the potential eventual dose-related risk of dementia.  But recent studies from 2020  dispute this association between benzodiazepines and dementia risk. Newer studies in 2020 do not support an association with dementia.  FU 12 mos  Meredith Staggersarey Cottle, MD, DFAPA   Please see After Visit Summary for patient specific instructions.  No future appointments.  No orders of the defined types were placed in this encounter.   -------------------------------

## 2021-04-18 NOTE — Patient Instructions (Signed)
Ozempic/Wegovy option for weight loss

## 2021-04-19 ENCOUNTER — Ambulatory Visit: Payer: Self-pay | Admitting: Psychiatry

## 2021-04-29 DIAGNOSIS — U071 COVID-19: Secondary | ICD-10-CM | POA: Diagnosis not present

## 2021-05-16 DIAGNOSIS — R309 Painful micturition, unspecified: Secondary | ICD-10-CM | POA: Diagnosis not present

## 2021-05-17 DIAGNOSIS — R059 Cough, unspecified: Secondary | ICD-10-CM | POA: Diagnosis not present

## 2021-05-17 DIAGNOSIS — J40 Bronchitis, not specified as acute or chronic: Secondary | ICD-10-CM | POA: Diagnosis not present

## 2021-05-17 DIAGNOSIS — J019 Acute sinusitis, unspecified: Secondary | ICD-10-CM | POA: Diagnosis not present

## 2022-04-05 ENCOUNTER — Encounter (INDEPENDENT_AMBULATORY_CARE_PROVIDER_SITE_OTHER): Payer: Self-pay

## 2022-04-25 ENCOUNTER — Telehealth: Payer: Self-pay | Admitting: Psychiatry

## 2022-04-25 NOTE — Telephone Encounter (Signed)
Will need paper chart to answer this.  No rush

## 2022-04-25 NOTE — Telephone Encounter (Signed)
Pt called and said that she needs to know what medicine she was prescribed that she had a horrible reaction and had to stop. She said it was back in 2015- 2017. Please call her at [phone]

## 2022-04-25 NOTE — Telephone Encounter (Signed)
Patient is asking about a medication she was on 2015-2017 that she had a bad reaction to. The notes in Epic only go back to 2020 and I don't see a mention of anything other than Depakote caused cognitive SE and Trileptal made tics worse. She said she thought the medication caused an elevated magnesium level. You were out of town when this occurred and the on-call person ordered labs. She thought it might be lithium. I know this was a long time ago. She said she could just discuss with you at her appt, but if it that far back will possibly need to pull chart from the basement to review.

## 2022-04-27 NOTE — Telephone Encounter (Signed)
Requested chart.

## 2022-05-03 NOTE — Telephone Encounter (Signed)
At your convenience,pls get paper chart

## 2022-07-04 ENCOUNTER — Telehealth: Payer: Self-pay | Admitting: Psychiatry

## 2022-07-04 NOTE — Telephone Encounter (Signed)
Pt has an appointment 1/29. She will need a refill on her orap 4 mg tablet quantity is 100. It needs to be called in to the [facility] in [place]. Phone number is [phone]

## 2022-07-04 NOTE — Telephone Encounter (Signed)
I'm not familiar with this med is it a controlled med?I want to make sure it's something I could send

## 2022-07-04 NOTE — Telephone Encounter (Signed)
She is overdue for an appointment.  She has not been seen in 2023 and was last seen in August 2022.  Has she canceled appointments?

## 2022-07-12 ENCOUNTER — Ambulatory Visit: Payer: BC Managed Care – PPO | Admitting: Psychiatry

## 2022-07-14 ENCOUNTER — Telehealth: Payer: Self-pay | Admitting: Psychiatry

## 2022-07-14 NOTE — Telephone Encounter (Signed)
Patient lvm stating that last week she called and asked for prescription to be sent to [facility] in [place] Pls rtc for directions on how to do this. Ph: [phone]

## 2022-07-17 ENCOUNTER — Other Ambulatory Visit: Payer: Self-pay | Admitting: Psychiatry

## 2022-07-17 MED ORDER — PIMOZIDE 2 MG PO TABS: 4.0000 mg | ORAL_TABLET | Freq: Every day | ORAL | 0 refills | Status: DC

## 2022-07-17 NOTE — Telephone Encounter (Signed)
RX printed.  Orap to [facility] pharmacy Getting from [facility] ph [phone] F [phone]

## 2022-07-17 NOTE — Telephone Encounter (Signed)
We have a note on the pharmacy to send this.  I will only give enough to last until her appt bc cancelled last 2 appts and hasn't been seen in over a year

## 2022-07-18 NOTE — Telephone Encounter (Signed)
Rx faxed

## 2022-07-19 NOTE — Telephone Encounter (Signed)
PT called back and said that the orap needs to be sent as 4 mg and quanitity oif 100 . This is the only way in [place] can fill it . Please cancel and resubmit to [facility] pharmacy

## 2022-07-27 ENCOUNTER — Telehealth: Payer: Self-pay | Admitting: Psychiatry

## 2022-07-27 NOTE — Telephone Encounter (Signed)
Call the pharmacy and find out if this is true.  She's cancelled appts and I want to make sure she is not going to do it again.

## 2022-07-27 NOTE — Telephone Encounter (Signed)
LVM to [facility] 

## 2022-07-27 NOTE — Telephone Encounter (Signed)
Pt left message needs call back has questions about Rx. [phone] Apt 1/29

## 2022-07-28 NOTE — Telephone Encounter (Signed)
Spoke to pharmacy and changed rx

## 2022-08-16 ENCOUNTER — Encounter: Payer: Self-pay | Admitting: Psychiatry

## 2022-08-16 ENCOUNTER — Ambulatory Visit (INDEPENDENT_AMBULATORY_CARE_PROVIDER_SITE_OTHER): Payer: 59 | Admitting: Psychiatry

## 2022-08-16 DIAGNOSIS — F5105 Insomnia due to other mental disorder: Secondary | ICD-10-CM | POA: Diagnosis not present

## 2022-08-16 DIAGNOSIS — F902 Attention-deficit hyperactivity disorder, combined type: Secondary | ICD-10-CM

## 2022-08-16 DIAGNOSIS — F952 Tourette's disorder: Secondary | ICD-10-CM

## 2022-08-16 DIAGNOSIS — F4001 Agoraphobia with panic disorder: Secondary | ICD-10-CM | POA: Diagnosis not present

## 2022-08-16 NOTE — Progress Notes (Signed)
Rebecca [name] [id number] [birth year] 36 y.o.  Subjective:   Patient ID:  Rebecca [name] is a 36 y.o. (DOB Jan 08, 1986) female.  Chief Complaint:  Chief Complaint  Patient presents with   Follow-up   Panic Attack   Anxiety   Tourette Syndrome    Medication Refill Associated symptoms include headaches. Pertinent negatives include no fatigue or weakness.  Anxiety     Rebecca [name] presents to the office today for follow-up of tics and panic disorder.  seen in September.  Panic was bad and citalopram was added with increase in clonazepam to 1.5 mg HS.  She never took citalopram bc fear of heart issues.  She placed a phone call here after the visit and was reassured but still did not take the medicine.  seen In December 2020.  The followiing was discussed: Disc phentermine and weight loss.  "I'm desperate to lose weight."   Okay to start phentermine 37.5 mg tablets one half every morning for 7 days then 1 every morning.  #30 and 1 refill sent in. Continue Orap 4 mg 1 tablet daily.    04/21/20 appt withe the following noted: Never took the phentermine bc of fear of BP effects. Only takes clonazepam for sleep a few times per month. On topiramate 100 mg is max dose for HA.    Getting a new job.  Not changing bc of tics.  Has not been losing jobs because of tics lately.  The same, fine with tics.  It doesn't change re: tics.  I learn to live with it.  No complaints lately with boss at work about tics.  Tics will worsen if nervous or upset or around crowds.  Here and there has anxiety.  Long drive to work and worked up when she gets home from work and deal with kids at night.  Poor sleep.  Restless sleep and up 1/2 the night.    Not really taking clonazepam 1 mg much bc inconsistent effects on her sleep.  Tries not to drink in evening.  Tries to keep it in the morning.  Avoids after lunch.  Eats dinner early.  Both initial and terminal insomnia and averages ? Amount of sleep.   Tired in the day but stays awake.    [name]  No panic now at all.  No clonazepam except rare.  Never during the day.  Patient reports stable mood and denies depressed or irritable moods.  Patient denies any recent difficulty with anxiety.  Sleep issues and has talked to PCP about probable sleep apnea. Denies appetite disturbance.  Patient reports that energy and motivation have been good.  Patient denies any difficulty with concentration.  Patient denies any suicidal ideation.  She tried to reduce Orap from 4 to 2 mg for a week but the Tourette's got worse and she increased it back to 4 mg.  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  Bosses not complaining at this time.  Manageable on the 4 mg.  Wasn't sure if decrease helped tiredness.  04/18/21 appt noted: Tourette's the same and hasn't lost jobs.  Hasn't had problems at this job. Been there 18 mos. Patient reports stable mood and denies depressed or irritable moods.  Patient denies any recent difficulty with anxiety.  Patient denies difficulty with sleep initiation or maintenance. Denies appetite disturbance.  Patient reports that energy and motivation have been good.  Patient denies any difficulty with concentration.  Patient denies any suicidal ideation. Rare use of clonazepam. Orap in [place] is still  very expensive. No SE except hungry.  08/16/22 appt noted: Patient reports stable mood and denies depressed or irritable moods.  Patient denies any recent difficulty with anxiety.  Patient denies difficulty with sleep initiation or maintenance. Denies appetite disturbance.  Patient reports that energy and motivation have been good.  Patient denies any difficulty with concentration.  Patient denies any suicidal ideation. Stress with son. Tics are stable and chronic.  Not causing problems at work.  Orap works very well at 4 mg daily.  Cannot do wihtout Orap.  Still too expensive to get in  the [place]. Switch to 12 hour shifts DT kids.   36 yo had rhabdomylosis.  Had to be out of work a lot DT son.  Has lost jobs DT tics.  At the same job since January.  Good so far.  [facility].  Enjoys geriatrics.    Past Psychiatric Medication Trials: Orap since high school, Depakote cog SE and high ammonia, Trileptal worsened tics, clonidine remote,  sertraline, topiramate,  history of ADD meds clonazepam 1,  Review of Systems:  Review of Systems  Constitutional:  Positive for unexpected weight change. Negative for fatigue.  Neurological:  Positive for headaches. Negative for tremors and weakness.       Tics  Psychiatric/Behavioral:  Negative for agitation and dysphoric mood.     Medications: I have reviewed the patient's current medications.  Current Outpatient Medications  Medication Sig Dispense Refill   levothyroxine (SYNTHROID, LEVOTHROID) 75 MCG tablet Take 75 mcg by mouth daily before breakfast.      metoprolol succinate (TOPROL-XL) 100 MG 24 hr tablet Take 100 mg by mouth daily. Take with or immediately following a meal.     omeprazole (PRILOSEC) 20 MG capsule omeprazole 20 mg cpdr     pimozide (ORAP) 2 MG tablet Take 2 tablets (4 mg total) by mouth daily. 120 tablet 0   valsartan-hydrochlorothiazide (DIOVAN-HCT) 320-12.5 MG tablet Take 1 tablet by mouth daily.     clonazePAM (KLONOPIN) 1 MG tablet Take 1 tablet (1 mg total) by mouth at bedtime as needed for anxiety. (Patient not taking: Reported on 08/16/2022) 90 tablet 0   No current facility-administered medications for this visit.    Medication Side Effects: Fatigue and Other: tired  Allergies:  Allergies  Allergen Reactions   Diphenhydramine Hcl     REACTION: interacts with orap   Promethazine Hcl     REACTION: interacts with orap   Pseudoephedrine     REACTION: colitis   Sulfonamide Derivatives     REACTION: ? rash   Zithromax [Azithromycin]     Interacts with orap that patient is taking     Past Medical History:  Diagnosis Date   GERD (gastroesophageal reflux disease)    Hypertension    Hypothyroidism    Migraine    Thyroid disease    Tourette's syndrome    Vitamin B 12 deficiency    Vitamin D deficiency     Family History  Problem Relation Age of Onset   Heart disease Father    Hypertension Father    Heart attack Father    Asthma Son    ADD / ADHD Son    Hypertension Paternal Grandmother    Cancer Paternal Grandfather    Headache Mother    Hyperlipidemia Mother     Social History   Socioeconomic History   Marital status: Married  Spouse name: Nekayla Heider   Number of children: 2   Years of education: associates   Highest education level: Not on file  Occupational History   Occupation: CMA  Tobacco Use   Smoking status: Never   Smokeless tobacco: Never  Substance and Sexual Activity   Alcohol use: Never   Drug use: Never   Sexual activity: Yes    Birth control/protection: Other-see comments    Comment: husband vasectomy  Other Topics Concern   Not on file  Social History Narrative   Lives at home with husband and 2 children   Left handed   Caffeine: 1-2 cups/day   Social Determinants of Health   Financial Resource Strain: Not on file  Food Insecurity: Not on file  Transportation Needs: Not on file  Physical Activity: Not on file  Stress: Not on file  Social Connections: Not on file  Intimate Partner Violence: Not on file    Past Medical History, Surgical history, Social history, and Family history were reviewed and updated as appropriate.   Please see review of systems for further details on the patient's review from today.   Objective:   Physical Exam:  There were no vitals taken for this visit.  Physical Exam Constitutional:      General: She is not in acute distress.    Appearance: She is well-developed. She is obese.  Musculoskeletal:        General: No deformity.  Neurological:     Mental Status: She is alert and  oriented to person, place, and time.     Coordination: Coordination normal.     Comments: Tics not seen in office today  Psychiatric:        Attention and Perception: Attention and perception normal. She does not perceive auditory or visual hallucinations.        Mood and Affect: Mood normal. Mood is not anxious or depressed. Affect is not labile, blunt, angry or inappropriate.        Speech: Speech normal.        Behavior: Behavior normal.        Thought Content: Thought content normal. Thought content is not paranoid or delusional. Thought content does not include homicidal or suicidal ideation. Thought content does not include suicidal plan.        Cognition and Memory: Cognition and memory normal.        Judgment: Judgment normal.     Comments: Insight fair.  Anxiety minimal No tics in the office noted     Lab Review:     Component Value Date/Time   NA 138 03/16/2020 1433   K 4.2 03/16/2020 1433   CL 105 03/16/2020 1433   CO2 19 (L) 03/16/2020 1433   GLUCOSE 85 03/16/2020 1433   GLUCOSE 86 04/02/2013 0115   BUN 11 03/16/2020 1433   CREATININE 0.67 03/16/2020 1433   CALCIUM 9.1 03/16/2020 1433   PROT 6.3 03/16/2020 1433   ALBUMIN 4.0 03/16/2020 1433   AST 11 03/16/2020 1433   ALT 17 03/16/2020 1433   ALKPHOS 88 03/16/2020 1433   BILITOT 0.3 03/16/2020 1433   GFRNONAA 113 03/16/2020 1433   GFRAA 131 03/16/2020 1433       Component Value Date/Time   WBC 9.4 03/16/2020 1433   WBC 11.1 (H) 04/02/2013 0115   RBC 4.95 03/16/2020 1433   RBC 4.34 04/02/2013 0115   HGB 13.0 03/16/2020 1433   HCT 41.7 03/16/2020 1433   PLT 311 03/16/2020 1433  MCV 84 03/16/2020 1433   MCH 26.3 (L) 03/16/2020 1433   MCH 30.0 04/02/2013 0115   MCHC 31.2 (L) 03/16/2020 1433   MCHC 34.8 04/02/2013 0115   RDW 14.9 03/16/2020 1433   LYMPHSABS 2.9 03/16/2020 1433   MONOABS 0.7 04/02/2013 0115   EOSABS 0.1 03/16/2020 1433   BASOSABS 0.1 03/16/2020 1433    No results found for: "POCLITH",  "LITHIUM"   No results found for: "PHENYTOIN", "PHENOBARB", "VALPROATE", "CBMZ"   .res Assessment: Plan:    Rebecca [name] was seen today for follow-up, panic attack, anxiety and tourette syndrome.  Diagnoses and all orders for this visit:  Tourette syndrome  Panic disorder with agoraphobia  Attention deficit hyperactivity disorder (ADHD), combined type  Insomnia due to mental condition  Obesity Likely obstructive sleep apnea  Patient has a long history of Tourette syndrome, ADD, and panic disorder.  She has lost a number of jobs over the years due to her Tourette's.  When the Tourette's is bad it causes lots of panic symptoms as well.  She has tried 6 mg of Orap in the past but does not like the side effects.  She is also generally reluctant to take higher dosages even though she still has significant symptoms.  She is also reluctant to try new medications. the last 24 months she is been relatively stable more so than usual, therefore it is not necessary to initiate a med change today.  Based on past history she is likely to have a relapse and exacerbation of Tourette's and panic again in the future without Orap.   At that time besides increasing Orap other considerations could be clonidine or increasing clonazepam.  I am hesitant to push her to start clonazepam at this time because of the probable sleep apnea discussed below.  Benzodiazepines can worsen sleep apnea.  Disc weight loss effects and SE of topirmate and off label use for tics.  Continue Orap 4 mg 1 tablet daily. Has to get Orap from [place] DT expense.  She is prescribed Orap 4 mg tablets 1 daily #400, no refills.  Orap 4 mg tablet size is not an option in epic.  Prescription had to be handwritten.  Discussed potential metabolic side effects associated with atypical antipsychotics, as well as potential risk for movement side effects. Advised pt to contact office if movement side effects occur.   She uses clonazepam sparingly.  This  is sometimes used as a secondary treatment for Tourette syndrome as well. We discussed the short-term risks associated with benzodiazepines including sedation and increased fall risk among others.  Discussed long-term side effect risk including dependence, potential withdrawal symptoms, and the potential eventual dose-related risk of dementia.  But recent studies from 2020 dispute this association between benzodiazepines and dementia risk. Newer studies in 2020 do not support an association with dementia.  Refer son to Dr. Stevphen Rochester  FU 12 mos  Meredith Staggers, MD, DFAPA   Please see After Visit Summary for patient specific instructions.  Future Appointments  Date Time Provider Department Center  09/25/2022  4:30 PM Cottle, Steva Ready., MD [facility] None    No orders of the defined types were placed in this encounter.   -------------------------------

## 2022-09-25 ENCOUNTER — Ambulatory Visit: Payer: BC Managed Care – PPO | Admitting: Psychiatry

## 2022-10-23 DIAGNOSIS — E876 Hypokalemia: Secondary | ICD-10-CM | POA: Diagnosis not present

## 2022-10-23 DIAGNOSIS — A0811 Acute gastroenteropathy due to Norwalk agent: Secondary | ICD-10-CM | POA: Diagnosis not present

## 2022-11-01 DIAGNOSIS — E876 Hypokalemia: Secondary | ICD-10-CM | POA: Diagnosis not present

## 2022-12-14 DIAGNOSIS — N3000 Acute cystitis without hematuria: Secondary | ICD-10-CM | POA: Diagnosis not present

## 2023-02-24 ENCOUNTER — Emergency Department (HOSPITAL_BASED_OUTPATIENT_CLINIC_OR_DEPARTMENT_OTHER)
Admission: EM | Admit: 2023-02-24 | Discharge: 2023-02-24 | Disposition: A | Discharge status: Home / Self Care | Payer: Managed Care, Other (non HMO) | Attending: Emergency Medicine | Admitting: Emergency Medicine

## 2023-02-24 ENCOUNTER — Emergency Department (HOSPITAL_BASED_OUTPATIENT_CLINIC_OR_DEPARTMENT_OTHER): Payer: Managed Care, Other (non HMO)

## 2023-02-24 ENCOUNTER — Encounter (HOSPITAL_BASED_OUTPATIENT_CLINIC_OR_DEPARTMENT_OTHER): Payer: Self-pay | Admitting: Emergency Medicine

## 2023-02-24 DIAGNOSIS — I10 Essential (primary) hypertension: Secondary | ICD-10-CM | POA: Diagnosis not present

## 2023-02-24 DIAGNOSIS — Z79899 Other long term (current) drug therapy: Secondary | ICD-10-CM | POA: Diagnosis not present

## 2023-02-24 DIAGNOSIS — N134 Hydroureter: Secondary | ICD-10-CM | POA: Diagnosis not present

## 2023-02-24 DIAGNOSIS — N201 Calculus of ureter: Secondary | ICD-10-CM | POA: Insufficient documentation

## 2023-02-24 DIAGNOSIS — N1330 Unspecified hydronephrosis: Secondary | ICD-10-CM

## 2023-02-24 DIAGNOSIS — R109 Unspecified abdominal pain: Secondary | ICD-10-CM | POA: Diagnosis present

## 2023-02-24 LAB — CBC WITH DIFFERENTIAL/PLATELET
Abs Immature Granulocytes: 0.04 10*3/uL (ref 0.00–0.07)
Basophils Absolute: 0.1 10*3/uL (ref 0.0–0.1)
Basophils Relative: 0 %
Eosinophils Absolute: 0.1 10*3/uL (ref 0.0–0.5)
Eosinophils Relative: 0 %
HCT: 39.7 % (ref 36.0–46.0)
Hemoglobin: 13.1 g/dL (ref 12.0–15.0)
Immature Granulocytes: 0 %
Lymphocytes Relative: 17 %
Lymphs Abs: 2.3 10*3/uL (ref 0.7–4.0)
MCH: 27.3 pg (ref 26.0–34.0)
MCHC: 33 g/dL (ref 30.0–36.0)
MCV: 82.7 fL (ref 80.0–100.0)
Monocytes Absolute: 0.7 10*3/uL (ref 0.1–1.0)
Monocytes Relative: 5 %
Neutro Abs: 10.5 10*3/uL — ABNORMAL HIGH (ref 1.7–7.7)
Neutrophils Relative %: 78 %
Platelets: 360 10*3/uL (ref 150–400)
RBC: 4.8 MIL/uL (ref 3.87–5.11)
RDW: 15.3 % (ref 11.5–15.5)
WBC: 13.6 10*3/uL — ABNORMAL HIGH (ref 4.0–10.5)
nRBC: 0 % (ref 0.0–0.2)

## 2023-02-24 LAB — COMPREHENSIVE METABOLIC PANEL
ALT: 20 U/L (ref 0–44)
AST: 14 U/L — ABNORMAL LOW (ref 15–41)
Albumin: 4.2 g/dL (ref 3.5–5.0)
Alkaline Phosphatase: 69 U/L (ref 38–126)
Anion gap: 11 (ref 5–15)
BUN: 10 mg/dL (ref 6–20)
CO2: 25 mmol/L (ref 22–32)
Calcium: 10 mg/dL (ref 8.9–10.3)
Chloride: 100 mmol/L (ref 98–111)
Creatinine, Ser: 0.83 mg/dL (ref 0.44–1.00)
GFR, Estimated: >60 mL/min (ref >60)
Glucose, Bld: 107 mg/dL — ABNORMAL HIGH (ref 70–99)
Potassium: 3.8 mmol/L (ref 3.5–5.1)
Sodium: 136 mmol/L (ref 135–145)
Total Bilirubin: 0.3 mg/dL (ref 0.3–1.2)
Total Protein: 7.3 g/dL (ref 6.5–8.1)

## 2023-02-24 LAB — URINALYSIS, ROUTINE W REFLEX MICROSCOPIC
Bacteria, UA: NONE SEEN
Bilirubin Urine: NEGATIVE
Glucose, UA: NEGATIVE mg/dL
Ketones, ur: NEGATIVE mg/dL
Nitrite: NEGATIVE
RBC / HPF: >50 RBC/hpf (ref 0–5)
Specific Gravity, Urine: 1.025 (ref 1.005–1.030)
pH: 5.5 (ref 5.0–8.0)

## 2023-02-24 LAB — PREGNANCY, URINE: Preg Test, Ur: NEGATIVE

## 2023-02-24 LAB — LIPASE, BLOOD: Lipase: 20 U/L (ref 11–51)

## 2023-02-24 MED ORDER — DICYCLOMINE HCL 20 MG PO TABS: 20.0000 mg | ORAL_TABLET | Freq: Two times a day (BID) | ORAL | 0 refills | Status: DC | PRN

## 2023-02-24 MED ORDER — DICYCLOMINE HCL 20 MG PO TABS: 20.0000 mg | ORAL_TABLET | Freq: Two times a day (BID) | ORAL | 0 refills | Status: AC | PRN

## 2023-02-24 MED ORDER — ONDANSETRON HCL 4 MG PO TABS: 4.0000 mg | ORAL_TABLET | Freq: Three times a day (TID) | ORAL | 0 refills | Status: AC | PRN

## 2023-02-24 MED ORDER — FENTANYL CITRATE PF 50 MCG/ML IJ SOSY
50.0000 ug | PREFILLED_SYRINGE | Freq: Once | INTRAMUSCULAR | Status: AC
  Administered 2023-02-24: 50 ug via INTRAVENOUS
  Filled 2023-02-24: qty 1

## 2023-02-24 MED ORDER — IBUPROFEN 600 MG PO TABS: 600.0000 mg | ORAL_TABLET | Freq: Three times a day (TID) | ORAL | 0 refills | Status: DC | PRN

## 2023-02-24 MED ORDER — LACTATED RINGERS IV BOLUS
1000.0000 mL | Freq: Once | INTRAVENOUS | Status: AC
  Administered 2023-02-24: 1000 mL via INTRAVENOUS

## 2023-02-24 MED ORDER — KETOROLAC TROMETHAMINE 15 MG/ML IJ SOLN
15.0000 mg | Freq: Once | INTRAMUSCULAR | Status: AC
  Administered 2023-02-24: 15 mg via INTRAVENOUS
  Filled 2023-02-24: qty 1

## 2023-02-24 MED ORDER — IBUPROFEN 600 MG PO TABS: 600.0000 mg | ORAL_TABLET | Freq: Three times a day (TID) | ORAL | 0 refills | Status: AC | PRN

## 2023-02-24 MED ORDER — ONDANSETRON HCL 4 MG PO TABS: 4.0000 mg | ORAL_TABLET | Freq: Three times a day (TID) | ORAL | 0 refills | Status: DC | PRN

## 2023-02-24 MED ORDER — SODIUM CHLORIDE 0.9 % IV SOLN
1.0000 g | Freq: Once | INTRAVENOUS | Status: AC
  Administered 2023-02-24: 1 g via INTRAVENOUS
  Filled 2023-02-24: qty 10

## 2023-02-24 MED ORDER — ONDANSETRON HCL 4 MG/2ML IJ SOLN
4.0000 mg | Freq: Once | INTRAMUSCULAR | Status: DC
  Filled 2023-02-24: qty 2

## 2023-02-24 NOTE — ED Triage Notes (Signed)
Back/flank pain  Started  2 hours ago.

## 2023-02-24 NOTE — ED Notes (Signed)
Bladder scan post void reading 28 ml in bladder.

## 2023-02-24 NOTE — Discharge Instructions (Signed)
Thank you for letting us take care of you today.  Your CT scan showed that you have passed the kidney stone.  It is now in your bladder.  Typically after a stone gets your bladder you do not have difficulty passing it out of the urethra.  A stone causes the most pain when is passing from the ureters to the bladder as these are very narrow tubes that are often scraped by the stone that is passing through.  You may have a minimal underlying urinary tract infection.  We gave you a dose of IV antibiotics to treat this in the ED.  Since you have never had kidney stones, I recommend that you follow-up with urology to discuss if you need any further workup of why you are developing stones.  I provided the urologist that we have on call today.  Please call their office and schedule a follow-up appointment.  Follow-up with your PCP next week to discuss your ED visit today and any continued symptoms.  For new symptoms or worsening condition, return to the nearest ED for reevaluation.

## 2023-02-24 NOTE — ED Notes (Signed)
Patient transported to CT 

## 2023-02-24 NOTE — ED Provider Notes (Signed)
Rebecca [name] EMERGENCY DEPARTMENT AT [facility] Provider Note   CSN: [id number] Arrival date & time: 02/24/23  1530     History  Chief Complaint  Patient presents with   Back Pain   Flank Pain    Rebecca [name] is a 37 y.o. female with past medical history hypertension, thyroid disease, Tourette's syndrome, prediabetes who presents to the ED complaining of abrupt onset of severe left flank pain 3 hours ago associated with nausea and vomiting. Last bowel movement this morning and normal. No fever, chills, dysuria, hematuria, chest pain, or shortness of breath. Admits to urinary frequency and states that she feels like she is not emptying her bladder.  She took an oxycodone before arrival to try to help with her pain but immediately vomited this and did not have any relief.  No history of the symptoms.  No history of kidney problems or stones.  Previous abdominal surgeries include cholecystectomy, C-section, uterine ablation.      Home Medications Prior to Admission medications   Medication Sig Start Date End Date Taking? Authorizing Provider  clonazePAM (KLONOPIN) 1 MG tablet Take 1 tablet (1 mg total) by mouth at bedtime as needed for anxiety. Patient not taking: Reported on 08/16/2022 04/21/20   Lauraine Rinne., MD  dicyclomine (BENTYL) 20 MG tablet Take 1 tablet (20 mg total) by mouth 2 (two) times daily as needed for up to 3 days for spasms. 02/24/23 02/27/23  Violet Cart L, PA-C  ibuprofen (ADVIL) 600 MG tablet Take 1 tablet (600 mg total) by mouth every 8 (eight) hours as needed for up to 3 days for mild pain or moderate pain. 02/24/23 02/27/23  Geniva Lohnes, Dow Adolph L, PA-C  levothyroxine (SYNTHROID, LEVOTHROID) 75 MCG tablet Take 75 mcg by mouth daily before breakfast.     [provider]  metoprolol succinate (TOPROL-XL) 100 MG 24 hr tablet Take 100 mg by mouth daily. Take with or immediately following a meal.    [provider]  omeprazole (PRILOSEC) 20 MG  capsule omeprazole 20 mg cpdr    [provider]  ondansetron (ZOFRAN) 4 MG tablet Take 1 tablet (4 mg total) by mouth every 8 (eight) hours as needed for up to 3 days for nausea or vomiting. 02/24/23 02/27/23  Meliana Canner L, PA-C  pimozide (ORAP) 2 MG tablet Take 2 tablets (4 mg total) by mouth daily. 07/17/22   Cottle, Steva Ready., MD  valsartan-hydrochlorothiazide (DIOVAN-HCT) 320-12.5 MG tablet Take 1 tablet by mouth daily.    [provider]      Allergies    Diphenhydramine hcl, Promethazine hcl, Pseudoephedrine, Sulfonamide derivatives, and Zithromax [azithromycin]    Review of Systems   Review of Systems  All other systems reviewed and are negative.   Physical Exam Updated Vital Signs BP 132/74 (BP Location: Right Arm)   Pulse 92   Temp 98 F (36.7 C) (Oral)   Resp 18   SpO2 98%  Physical Exam Vitals and nursing note reviewed.  Constitutional:      General: She is in acute distress (mild secondary to pain).     Appearance: Normal appearance. She is not ill-appearing, toxic-appearing or diaphoretic.  HENT:     Head: Normocephalic and atraumatic.     Mouth/Throat:     Mouth: Mucous membranes are moist.  Eyes:     General: No scleral icterus.    Conjunctiva/sclera: Conjunctivae normal.  Cardiovascular:     Rate and Rhythm: Normal rate and regular rhythm.  Heart sounds: No murmur heard. Pulmonary:     Effort: Pulmonary effort is normal. No respiratory distress.     Breath sounds: Normal breath sounds. No stridor. No wheezing, rhonchi or rales.  Chest:     Chest wall: No tenderness.  Abdominal:     General: Abdomen is flat. There is no distension.     Palpations: Abdomen is soft.     Tenderness: There is abdominal tenderness (LLQ, L flank). There is left CVA tenderness. There is no right CVA tenderness, guarding or rebound.  Musculoskeletal:        General: Normal range of motion.     Cervical back: Normal range of motion and neck supple. No  rigidity.     Right lower leg: No edema.     Left lower leg: No edema.  Skin:    General: Skin is warm and dry.     Capillary Refill: Capillary refill takes less than 2 seconds.  Neurological:     Mental Status: She is alert. Mental status is at baseline.  Psychiatric:        Behavior: Behavior normal.     ED Results / Procedures / Treatments   Labs (all labs ordered are listed, but only abnormal results are displayed) Labs Reviewed  URINALYSIS, ROUTINE W REFLEX MICROSCOPIC - Abnormal; Notable for the following components:      Result Value   APPearance HAZY (*)    Hgb urine dipstick LARGE (*)    Protein, ur TRACE (*)    Leukocytes,Ua TRACE (*)    All other components within normal limits  CBC WITH DIFFERENTIAL/PLATELET - Abnormal; Notable for the following components:   WBC 13.6 (*)    Neutro Abs 10.5 (*)    All other components within normal limits  COMPREHENSIVE METABOLIC PANEL - Abnormal; Notable for the following components:   Glucose, Bld 107 (*)    AST 14 (*)    All other components within normal limits  PREGNANCY, URINE  LIPASE, BLOOD    EKG None  Radiology CT Renal Stone Study  Result Date: 02/24/2023 CLINICAL DATA:  Acute onset left flank pain 2 hours ago. EXAM: CT ABDOMEN AND PELVIS WITHOUT CONTRAST TECHNIQUE: Multidetector CT imaging of the abdomen and pelvis was performed following the standard protocol without IV contrast. RADIATION DOSE REDUCTION: This exam was performed according to the departmental dose-optimization program which includes automated exposure control, adjustment of the mA and/or kV according to patient size and/or use of iterative reconstruction technique. COMPARISON:  04/02/2013 FINDINGS: Lower chest: No acute findings. Hepatobiliary: No mass visualized on this unenhanced exam. Prior cholecystectomy. No evidence of biliary obstruction. Pancreas: No mass or inflammatory process visualized on this unenhanced exam. Spleen:  Within normal limits  in size. Adrenals/Urinary tract: A few tiny 1-2 mm left renal calculi are seen. Mild left hydroureteronephrosis is seen to the level of the bladder. A 2 mm calculus is seen in the bladder near the left UVJ. Stomach/Bowel: No evidence of obstruction, inflammatory process, or abnormal fluid collections. Normal appendix visualized. Vascular/Lymphatic: No pathologically enlarged lymph nodes identified. No evidence of abdominal aortic aneurysm. Reproductive: Tiny calcified uterine fibroid noted. Adnexal regions are unremarkable. Other:  None. Musculoskeletal: No suspicious bone lesions identified. Chronic bilateral L5 pars defects noted, without associated spondylolisthesis. IMPRESSION: Mild left hydroureteronephrosis, with 2 mm calculus in the bladder near the left UVJ. Tiny left renal calculi. Electronically Signed   By: Danae Orleans M.D.   On: 02/24/2023 17:20    Procedures Procedures  Medications Ordered in ED Medications  ondansetron (ZOFRAN) injection 4 mg (0 mg Intravenous Hold 02/24/23 1614)  ketorolac (TORADOL) 15 MG/ML injection 15 mg (15 mg Intravenous Given 02/24/23 1600)  lactated ringers bolus 1,000 mL (0 mLs Intravenous Stopped 02/24/23 1730)  fentaNYL (SUBLIMAZE) injection 50 mcg (50 mcg Intravenous Given 02/24/23 1611)  cefTRIAXone (ROCEPHIN) 1 g in sodium chloride 0.9 % 100 mL IVPB (0 g Intravenous Stopped 02/24/23 1847)    ED Course/ Medical Decision Making/ A&P                             Medical Decision Making Amount and/or Complexity of Data Reviewed Labs: ordered. Decision-making details documented in ED Course. Radiology: ordered. Decision-making details documented in ED Course.  Risk Prescription drug management.   Medical Decision Making:   Rebecca [name] is a 37 y.o. female who presented to the ED today with abdominal pain detailed above.    Patient's presentation is complicated by their history of multiple comorbidities.  Complete initial physical exam  performed, notably the patient  was in distress secondary to pain.  She had left lower quadrant left CVA tenderness..    Reviewed and confirmed nursing documentation for past medical history, family history, social history.    Initial Assessment:   With the patient's presentation of abdominal pain, differential diagnosis includes but is not limited to AAA, mesenteric ischemia, appendicitis, diverticulitis, DKA, gastritis, gastroenteritis, AMI, nephrolithiasis, pancreatitis, peritonitis, adrenal insufficiency, intestinal ischemia, constipation, UTI, SBO/LBO, splenic rupture, biliary disease, IBD, IBS, PUD, hepatitis, STD, ovarian/testicular torsion, electrolyte disturbance, DKA, dehydration, acute kidney injury, renal failure, cholecystitis, cholelithiasis, choledocholithiasis, abdominal pain of  unknown etiology, pregnancy, incomplete abortion, septic abortion, threatened abortion, ectopic pregnancy, PID.   Initial Plan:  Screening labs including CBC and Metabolic panel to evaluate for infectious or metabolic etiology of disease.  Lipase to evaluate for pancreatitis Urinalysis with reflex culture ordered to evaluate for UTI or relevant urologic/nephrologic pathology.  CT abd/pelvis to evaluate for intra-abdominal pathology EKG to evaluate for cardiac pathology Symptomatic management Objective evaluation as reviewed   Initial Study Results:   Laboratory  All laboratory results reviewed without evidence of clinically relevant pathology.   Exceptions include: WBC 13.6, UA with large amount of hemoglobin, trace leukocytes, does appear contaminated with squamous epithelial cells  Radiology:  All images reviewed independently. Agree with radiology report at this time.   CT Renal Stone Study  Result Date: 02/24/2023 CLINICAL DATA:  Acute onset left flank pain 2 hours ago. EXAM: CT ABDOMEN AND PELVIS WITHOUT CONTRAST TECHNIQUE: Multidetector CT imaging of the abdomen and pelvis was performed  following the standard protocol without IV contrast. RADIATION DOSE REDUCTION: This exam was performed according to the departmental dose-optimization program which includes automated exposure control, adjustment of the mA and/or kV according to patient size and/or use of iterative reconstruction technique. COMPARISON:  04/02/2013 FINDINGS: Lower chest: No acute findings. Hepatobiliary: No mass visualized on this unenhanced exam. Prior cholecystectomy. No evidence of biliary obstruction. Pancreas: No mass or inflammatory process visualized on this unenhanced exam. Spleen:  Within normal limits in size. Adrenals/Urinary tract: A few tiny 1-2 mm left renal calculi are seen. Mild left hydroureteronephrosis is seen to the level of the bladder. A 2 mm calculus is seen in the bladder near the left UVJ. Stomach/Bowel: No evidence of obstruction, inflammatory process, or abnormal fluid collections. Normal appendix visualized. Vascular/Lymphatic: No pathologically enlarged lymph nodes identified. No evidence of abdominal  aortic aneurysm. Reproductive: Tiny calcified uterine fibroid noted. Adnexal regions are unremarkable. Other:  None. Musculoskeletal: No suspicious bone lesions identified. Chronic bilateral L5 pars defects noted, without associated spondylolisthesis. IMPRESSION: Mild left hydroureteronephrosis, with 2 mm calculus in the bladder near the left UVJ. Tiny left renal calculi. Electronically Signed   By: Danae Orleans M.D.   On: 02/24/2023 17:20      Final Assessment and Plan:   37 year old female presents to the ED with abrupt onset of severe left flank pain.  She is in acute distress on exam secondary to pain.  Associated nausea and vomiting.  No fever.  Some urinary hesitancy but no dysuria or gross hematuria.  Workup initiated as above for further assessment.  Following pain medications, patient with good pain control no relief on reassessment in the ED.  She has a minimal leukocytosis.  UA appears  contaminated so difficult to fully exclude underlying infection.  CT shows a 2 mm calculus in the bladder near the left UVJ.  Suspect that this is the etiology of patient's pain.  Discussed all findings in detail with patient.  Do believe that her pain should be well-controlled considering stone has passed into the bladder.  Patient expressed understanding of this.  Will give one-time dose of Rocephin to cover for any underlying infection though low suspicion for this.  Patient has never had a kidney stone before.  With this, we will refer her to urology for further workup.  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  Aware that I will provide her with information for the urologist on-call and she may choose to follow-up with whoever she desires.  Patient expressed understanding and agreement with discharge plan.  Strict ED return precautions given, all questions answered, and stable for discharge.   Clinical Impression:  1. Left ureteral stone   2. Hydroureteronephrosis      Discharge           Final Clinical Impression(s) / ED Diagnoses Final diagnoses:  Left ureteral stone  Hydroureteronephrosis    Rx / DC Orders ED Discharge Orders          Ordered    ondansetron (ZOFRAN) 4 MG tablet  Every 8 hours PRN,   Status:  Discontinued        02/24/23 1746    ibuprofen (ADVIL) 600 MG tablet  Every 8 hours PRN,   Status:  Discontinued        02/24/23 1746    dicyclomine (BENTYL) 20 MG tablet  2 times daily PRN,   Status:  Discontinued        02/24/23 1746    dicyclomine (BENTYL) 20 MG tablet  2 times daily PRN        02/24/23 1807    ibuprofen (ADVIL) 600 MG tablet  Every 8 hours PRN        02/24/23 1807    ondansetron (ZOFRAN) 4 MG tablet  Every 8 hours PRN        02/24/23 1807              Tonette Lederer, PA-C 02/24/23 1918    Alvira Monday, MD 02/25/23 1152

## 2023-04-17 ENCOUNTER — Ambulatory Visit: Payer: BC Managed Care – PPO | Admitting: Cardiology

## 2023-10-09 ENCOUNTER — Ambulatory Visit: Payer: 59 | Admitting: Psychiatry

## 2023-10-09 ENCOUNTER — Encounter: Payer: Self-pay | Admitting: Psychiatry

## 2023-10-09 DIAGNOSIS — F5105 Insomnia due to other mental disorder: Secondary | ICD-10-CM

## 2023-10-09 DIAGNOSIS — F952 Tourette's disorder: Secondary | ICD-10-CM | POA: Diagnosis not present

## 2023-10-09 DIAGNOSIS — F4001 Agoraphobia with panic disorder: Secondary | ICD-10-CM | POA: Diagnosis not present

## 2023-10-09 MED ORDER — CLONAZEPAM 1 MG PO TABS
1.0000 mg | ORAL_TABLET | Freq: Every evening | ORAL | 0 refills | Status: AC | PRN
Start: 2023-10-09 — End: ?

## 2023-10-09 MED ORDER — PIMOZIDE 2 MG PO TABS
4.0000 mg | ORAL_TABLET | Freq: Every day | ORAL | 3 refills | Status: AC
Start: 2023-10-09 — End: ?

## 2023-10-09 NOTE — Progress Notes (Signed)
 Rebecca [name] [id number] [birth year] 38 y.o.  Subjective:   Patient ID:  Rebecca [name] is a 38 y.o. (DOB [birth year]) female.  Chief Complaint:  Chief Complaint  Patient presents with   Follow-up    Medication Refill Associated symptoms include headaches. Pertinent negatives include no fatigue or weakness.  Anxiety Patient reports no nervous/anxious behavior.     Rebecca [name] presents to the office today for follow-up of tics and panic disorder.  seen in September.  Panic was bad and citalopram was added with increase in clonazepam to 1.5 mg HS.  She never took citalopram bc fear of heart issues.  She placed a phone call here after the visit and was reassured but still did not take the medicine.  seen In December 2020.  The followiing was discussed: Disc phentermine and weight loss.  "I'm desperate to lose weight."   Okay to start phentermine 37.5 mg tablets one half every morning for 7 days then 1 every morning.  #30 and 1 refill sent in. Continue Orap 4 mg 1 tablet daily.    04/21/20 appt withe the following noted: Never took the phentermine bc of fear of BP effects. Only takes clonazepam for sleep a few times per month. On topiramate 100 mg is max dose for HA.   Getting a new job.  Not changing bc of tics.  Has not been losing jobs because of tics lately.  The same, fine with tics.  It doesn't change re: tics.  I learn to live with it.  No complaints lately with boss at work about tics.  Tics will worsen if nervous or upset or around crowds.  Here and there has anxiety.  Long drive to work and worked up when she gets home from work and deal with kids at night.  Poor sleep.  Restless sleep and up 1/2 the night.    Not really taking clonazepam 1 mg much bc inconsistent effects on her sleep.  Tries not to drink in evening.  Tries to keep it in the morning.  Avoids after lunch.  Eats dinner early.  Both initial and terminal insomnia and averages ? Amount of sleep.  Tired  in the day but stays awake.    Rebecca [name]  No panic now at all.  No clonazepam except rare.  Never during the day.  Patient reports stable mood and denies depressed or irritable moods.  Patient denies any recent difficulty with anxiety.  Sleep issues and has talked to PCP about probable sleep apnea. Denies appetite disturbance.  Patient reports that energy and motivation have been good.  Patient denies any difficulty with concentration.  Patient denies any suicidal ideation.  She tried to reduce Orap from 4 to 2 mg for a week but the Tourette's got worse and she increased it back to 4 mg.  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  Bosses not complaining at this time.  Manageable on the 4 mg.  Wasn't sure if decrease helped tiredness.  04/18/21 appt noted: Tourette's the same and hasn't lost jobs.  Hasn't had problems at this job. Been there 18 mos. Patient reports stable mood and denies depressed or irritable moods.  Patient denies any recent difficulty with anxiety.  Patient denies difficulty with sleep initiation or maintenance. Denies appetite disturbance.  Patient reports that energy and motivation have been good.  Patient denies any difficulty with concentration.  Patient denies any suicidal ideation. Rare  use of clonazepam. Orap in [place] is still  very expensive. No SE except hungry.  08/16/22 appt noted: Patient reports stable mood and denies depressed or irritable moods.  Patient denies any recent difficulty with anxiety.  Patient denies difficulty with sleep initiation or maintenance. Denies appetite disturbance.  Patient reports that energy and motivation have been good.  Patient denies any difficulty with concentration.  Patient denies any suicidal ideation. Stress with son. Tics are stable and chronic.  Not causing problems at work.  Orap works very well at 4 mg daily.  Cannot do wihtout Orap.  Still too expensive to get in the  [place]. Switch to 12 hour shifts DT kids.   42 yo had rhabdomylosis.  Had to be out of work a lot DT son.  10/09/23  Orap 4 mg HS works wonders for me.  Would be basket case without it for Tourette's. Has been getting from [place] but now thinks can get it locally. No SE Anxiety has been fine but takes clonazepam rarely.  But would like to have it.  Not needing often but needs for anxiety and panic. Working [employer] 3 of 12 hour shifts and like it a lot better than being in doctors office.  Been there 5 mos. No dep.  Not sleep disturbance.    Has lost jobs DT tics.  At the same job since January.  Good so far.  At [facility] clinic.  Enjoys geriatrics.    Past Psychiatric Medication Trials: Orap since high school, Depakote cog SE and high ammonia, Trileptal worsened tics, clonidine remote,  sertraline, topiramate,  history of ADD meds clonazepam 1,  Review of Systems:  Review of Systems  Constitutional:  Negative for fatigue.  Neurological:  Positive for headaches. Negative for tremors and weakness.       Tics  Psychiatric/Behavioral:  Negative for agitation and dysphoric mood. The patient is not nervous/anxious.     Medications: I have reviewed the patient's current medications.  Current Outpatient Medications  Medication Sig Dispense Refill   levothyroxine (SYNTHROID, LEVOTHROID) 75 MCG tablet Take 75 mcg by mouth daily before breakfast.      metoprolol succinate (TOPROL-XL) 100 MG 24 hr tablet Take 100 mg by mouth daily. Take with or immediately following a meal.     omeprazole (PRILOSEC) 20 MG capsule omeprazole 20 mg cpdr     valsartan-hydrochlorothiazide (DIOVAN-HCT) 320-12.5 MG tablet Take 1 tablet by mouth daily.     clonazePAM (KLONOPIN) 1 MG tablet Take 1 tablet (1 mg total) by mouth at bedtime as needed for anxiety. 30 tablet 0   dicyclomine (BENTYL) 20 MG tablet Take 1 tablet (20 mg total) by mouth 2 (two) times daily as needed for up to 3 days for spasms. 6 tablet 0    pimozide (ORAP) 2 MG tablet Take 2 tablets (4 mg total) by mouth daily. 180 tablet 3   No current facility-administered medications for this visit.    Medication Side Effects: Fatigue and Other: tired  Allergies:  Allergies  Allergen Reactions   Diphenhydramine Hcl     REACTION: interacts with orap   Promethazine Hcl     REACTION: interacts with orap   Pseudoephedrine     REACTION: colitis   Sulfonamide Derivatives     REACTION: ? rash   Zithromax [Azithromycin]     Interacts with orap that patient is taking    Past Medical History:  Diagnosis Date   GERD (gastroesophageal reflux disease)    Hypertension  Hypothyroidism    Migraine    Thyroid disease    Tourette's syndrome    Vitamin B 12 deficiency    Vitamin D deficiency     Family History  Problem Relation Age of Onset   Heart disease Father    Hypertension Father    Heart attack Father    Asthma Son    ADD / ADHD Son    Hypertension Paternal Grandmother    Cancer Paternal Grandfather    Headache Mother    Hyperlipidemia Mother     Social History   Socioeconomic History   Marital status: Married    Spouse name: Ivonna Kinnick   Number of children: 2   Years of education: associates   Highest education level: Not on file  Occupational History   Occupation: CMA  Tobacco Use   Smoking status: Never   Smokeless tobacco: Never  Substance and Sexual Activity   Alcohol use: Never   Drug use: Never   Sexual activity: Yes    Birth control/protection: Other-see comments    Comment: husband vasectomy  Other Topics Concern   Not on file  Social History Narrative   Lives at home with husband and 2 children   Left handed   Caffeine: 1-2 cups/day   Social Drivers of Corporate investment banker Strain: Low Risk  (01/30/2023)   Received from [facility], [facility]   Overall Financial Resource Strain (CARDIA)    Difficulty of Paying Living Expenses: Not hard at all  Food Insecurity: No Food  Insecurity (01/30/2023)   Received from [facility]   Hunger Vital Sign    Worried About Running Out of Food in the Last Year: Never true    Ran Out of Food in the Last Year: Never true  Transportation Needs: No Transportation Needs (01/30/2023)   Received from [facility]   PRAPARE - Transportation    Lack of Transportation (Medical): No    Lack of Transportation (Non-Medical): No  Physical Activity: Unknown (01/30/2023)   Received from [facility]   Exercise Vital Sign    Days of Exercise per Week: 0 days    Minutes of Exercise per Session: Not on file  Stress: No Stress Concern Present (01/30/2023)   Received from [facility], [facility] - Occupational Stress Questionnaire    Feeling of Stress : Not at all  Social Connections: Socially Integrated (01/30/2023)   Received from [facility], [facility]   Social Network    How would you rate your social network (family, work, friends)?: Good participation with social networks  Intimate Partner Violence: Not At Risk (01/30/2023)   Received from [facility], [facility]   HITS    Over the last 12 months how often did your partner physically hurt you?: Never    Over the last 12 months how often did your partner insult you or talk down to you?: Never    Over the last 12 months how often did your partner threaten you with physical harm?: Never    Over the last 12 months how often did your partner scream or curse at you?: Never    Past Medical History, Surgical history, Social history, and Family history were reviewed and updated as appropriate.   Please see review of systems for further details on the patient's review from today.   Objective:   Physical Exam:  There were no vitals taken for this  visit.  Physical Exam Constitutional:      General: She is not in acute distress.    Appearance: She is well-developed. She is obese.   Musculoskeletal:        General: No deformity.  Neurological:     Mental Status: She is alert and oriented to person, place, and time.     Coordination: Coordination normal.     Comments: Tics not seen in office today  Psychiatric:        Attention and Perception: Attention and perception normal. She does not perceive auditory or visual hallucinations.        Mood and Affect: Mood normal. Mood is not anxious or depressed. Affect is not labile, blunt or angry.        Speech: Speech normal.        Behavior: Behavior normal.        Thought Content: Thought content normal. Thought content is not paranoid or delusional. Thought content does not include homicidal or suicidal ideation. Thought content does not include suicidal plan.        Cognition and Memory: Cognition and memory normal.        Judgment: Judgment normal.     Comments: Insight fair.  Anxiety minimal No tics in the office noted     Lab Review:     Component Value Date/Time   NA 136 02/24/2023 1545   NA 138 03/16/2020 1433   K 3.8 02/24/2023 1545   CL 100 02/24/2023 1545   CO2 25 02/24/2023 1545   GLUCOSE 107 (H) 02/24/2023 1545   BUN 10 02/24/2023 1545   BUN 11 03/16/2020 1433   CREATININE 0.83 02/24/2023 1545   CALCIUM 10.0 02/24/2023 1545   PROT 7.3 02/24/2023 1545   PROT 6.3 03/16/2020 1433   ALBUMIN 4.2 02/24/2023 1545   ALBUMIN 4.0 03/16/2020 1433   AST 14 (L) 02/24/2023 1545   ALT 20 02/24/2023 1545   ALKPHOS 69 02/24/2023 1545   BILITOT 0.3 02/24/2023 1545   BILITOT 0.3 03/16/2020 1433   GFRNONAA >60 02/24/2023 1545   GFRAA 131 03/16/2020 1433       Component Value Date/Time   WBC 13.6 (H) 02/24/2023 1545   RBC 4.80 02/24/2023 1545   HGB 13.1 02/24/2023 1545   HGB 13.0 03/16/2020 1433   HCT 39.7 02/24/2023 1545   HCT 41.7 03/16/2020 1433   PLT 360 02/24/2023 1545   PLT 311 03/16/2020 1433   MCV 82.7 02/24/2023 1545   MCV 84 03/16/2020 1433   MCH 27.3 02/24/2023 1545   MCHC 33.0 02/24/2023  1545   RDW 15.3 02/24/2023 1545   RDW 14.9 03/16/2020 1433   LYMPHSABS 2.3 02/24/2023 1545   LYMPHSABS 2.9 03/16/2020 1433   MONOABS 0.7 02/24/2023 1545   EOSABS 0.1 02/24/2023 1545   EOSABS 0.1 03/16/2020 1433   BASOSABS 0.1 02/24/2023 1545   BASOSABS 0.1 03/16/2020 1433    No results found for: "POCLITH", "LITHIUM"   No results found for: "PHENYTOIN", "PHENOBARB", "VALPROATE", "CBMZ"   .res Assessment: Plan:    Amyrie was seen today for follow-up.  Diagnoses and all orders for this visit:  Tourette syndrome -     pimozide (ORAP) 2 MG tablet; Take 2 tablets (4 mg total) by mouth daily.  Panic disorder with agoraphobia -     clonazePAM (KLONOPIN) 1 MG tablet; Take 1 tablet (1 mg total) by mouth at bedtime as needed for anxiety.  Insomnia due to mental condition -  clonazePAM (KLONOPIN) 1 MG tablet; Take 1 tablet (1 mg total) by mouth at bedtime as needed for anxiety.   Obesity Likely obstructive sleep apnea  Patient has a long history of Tourette syndrome, ADD, and panic disorder.  She has lost a number of jobs over the years due to her Tourette's.  When the Tourette's is bad it causes lots of panic symptoms as well.  She has tried 6 mg of Orap in the past but does not like the side effects.  She is also generally reluctant to take higher dosages even though she still has significant symptoms.  She is also reluctant to try new medications. the last 3 years she is been relatively stable more so than usual, therefore it is not necessary to initiate a med change today.  Based on past history she is likely to have a relapse and exacerbation of Tourette's and panic again in the future without Orap.   At that time besides increasing Orap other considerations could be clonidine or increasing clonazepam.    Continue Orap 4 mg  daily.  Discussed potential metabolic side effects associated with atypical antipsychotics, as well as potential risk for movement side effects. Advised pt  to contact office if movement side effects occur.   She uses clonazepam sparingly.  This is sometimes used as a secondary treatment for Tourette syndrome as well. We discussed the short-term risks associated with benzodiazepines including sedation and increased fall risk among others.  Discussed long-term side effect risk including dependence, potential withdrawal symptoms, and the potential eventual dose-related risk of dementia.  But recent studies from 2020 dispute this association between benzodiazepines and dementia risk. Newer studies in 2020 do not support an association with dementia.  FU 12 mos  Meredith Staggers, MD, DFAPA   Please see After Visit Summary for patient specific instructions.  No future appointments.   No orders of the defined types were placed in this encounter.   -------------------------------

## 2023-11-23 ENCOUNTER — Ambulatory Visit
Admission: RE | Admit: 2023-11-23 | Discharge: 2023-11-23 | Disposition: A | Discharge status: Home / Self Care | Source: Ambulatory Visit | Attending: Obstetrics and Gynecology | Admitting: Obstetrics and Gynecology

## 2023-11-23 ENCOUNTER — Ambulatory Visit

## 2023-11-23 ENCOUNTER — Other Ambulatory Visit

## 2023-11-23 ENCOUNTER — Encounter

## 2023-11-23 ENCOUNTER — Other Ambulatory Visit: Payer: Self-pay | Admitting: Obstetrics and Gynecology

## 2023-11-23 DIAGNOSIS — N61 Mastitis without abscess: Secondary | ICD-10-CM

## 2023-12-03 ENCOUNTER — Ambulatory Visit
Admission: RE | Admit: 2023-12-03 | Discharge: 2023-12-03 | Disposition: A | Discharge status: Home / Self Care | Source: Ambulatory Visit | Attending: Obstetrics and Gynecology | Admitting: Obstetrics and Gynecology

## 2023-12-03 ENCOUNTER — Other Ambulatory Visit: Payer: Self-pay | Admitting: Obstetrics and Gynecology

## 2023-12-03 DIAGNOSIS — N61 Mastitis without abscess: Secondary | ICD-10-CM

## 2023-12-03 DIAGNOSIS — D242 Benign neoplasm of left breast: Secondary | ICD-10-CM

## 2023-12-03 HISTORY — PX: BREAST BIOPSY: SHX20

## 2023-12-04 LAB — SURGICAL PATHOLOGY

## 2024-01-22 NOTE — Progress Notes (Unsigned)
 Cardiology Office Note:   Date:  01/23/2024  ID:  [name], Rebecca 1985/12/09, MRN [id number] PCP:  Victorio Grave, MD  [facility] Providers Cardiologist:  Alyssa Backbone, MD Referring MD: Victorio Grave, MD  Chief Complaint/Reason for Referral:  Chest pain ASSESSMENT:    1. Precordial pain   2. Palpitations   3. Primary hypertension   4. BMI 45.0-49.9, adult (HCC)   5. Tourette's disorder     PLAN:   In order of problems listed above: Chest pain:  We will obtain a coronary CTA and echocardiogram to evaluate further.  If the patient has mild obstructive coronary artery disease, they will require a statin (with goal LDL < 70) and aspirin, if they have high-grade disease we will need to consider optimal medical therapy and if symptoms are refractory to medical therapy, then a cardiac catheterization with possible PCI will be pursued to alleviate symptoms.  If they have high risk disease we will proceed directly to cardiac catheterization. Palpitations: Will obtain echocardiogram and monitor to evaluate further. Hypertension: Continue valsartan/hydrochlorothiazide 320 x 12.5 mg daily and metoprolol 100 mg daily.  BP under good control.  Elevated BMI: HbA1C 5.8 recently.  Thinking about compounded GLP-1 receptor agonist. Tourette's disorder: On pimozide .  EKG today demonstrates QTC 425.            Dispo:  Return if symptoms worsen or fail to improve.      Medication Adjustments/Labs and Tests Ordered: Current medicines are reviewed at length with the patient today.  Concerns regarding medicines are outlined above.  The following changes have been made:  no change   Labs/tests ordered: Orders Placed This Encounter  Procedures   CT CORONARY MORPH W/CTA COR W/SCORE W/CA W/CM &/OR WO/CM   LONG TERM MONITOR (3-14 DAYS)   EKG 12-Lead   ECHOCARDIOGRAM COMPLETE    Medication Changes: Meds ordered this encounter  Medications   metoprolol tartrate (LOPRESSOR) 100 MG  tablet    Sig: Take one tablet by mouth 2 hours before the cardiac CT scan.    Dispense:  1 tablet    Refill:  0    Current medicines are reviewed at length with the patient today.  The patient does not have concerns regarding medicines.    History of Present Illness:    FOCUSED PROBLEM LIST:   Hypertension Hypothyroidism Family history of early CAD BMI 47 Tourette's syndrome On pimozide   May 2025:  Patient consents to use of AI scribe. The patient is a 38 year old female with the above listed medical problems referred for conditions regarding chest pain and palpitations.  The patient was seen by her primary care provider recently.  She is on Orap  (pimozide ) chronically for Tourette's syndrome.  An EKG was performed which showed a QTc of around 468 ms similar to a previous EKG with a QTc of around 465 ms.  Additionally she noted an episode of chest tightness with shoulder pain and dyspnea.  She has experienced chest tightness and palpitations for the past couple of years. The chest tightness is described as very tight, occurring in her shoulders and radiating around her chest. It is not relieved by deep breathing and can occur at any time, regardless of activity or rest. The episodes are infrequent, occurring once every two to three months, and last for about one to two minutes. The chest tightness does not seem to be triggered by specific activities and does not significantly impact her lifestyle.  She also experiences palpitations, described as her  heart 'jumping or racing around', which she sometimes feels in her neck. These occur several times a month, possibly once a week, and are more frequent than the chest tightness. No lightheadedness or syncope.  Her past medical history includes hypertension, for which she takes two blood pressure medications, and she is on medication for Tourette's syndrome. She also takes thyroid  medication. She has a family history of heart disease, which  raises her concern about her symptoms.     Current Medications: Current Meds  Medication Sig   clonazePAM  (KLONOPIN ) 1 MG tablet Take 1 tablet (1 mg total) by mouth at bedtime as needed for anxiety.   cyanocobalamin  (VITAMIN B12) 1000 MCG/ML injection INJECT 1 ML IN THE MUSCLE ONCE A WEEK FOR 4 WEEKS THEN INJECT 1 ML MONTHLY   ketoconazole (NIZORAL) 2 % cream APPLY TOPICALLY TWICE DAILY AS NEEDED   levothyroxine (SYNTHROID, LEVOTHROID) 75 MCG tablet Take 75 mcg by mouth daily before breakfast.    metoprolol  succinate (TOPROL -XL) 100 MG 24 hr tablet Take 100 mg by mouth daily. Take with or immediately following a meal.   metoprolol  tartrate (LOPRESSOR ) 100 MG tablet Take one tablet by mouth 2 hours before the cardiac CT scan.   omeprazole (PRILOSEC) 20 MG capsule omeprazole 20 mg cpdr   pimozide  (ORAP ) 2 MG tablet Take 2 tablets (4 mg total) by mouth daily.   valsartan-hydrochlorothiazide (DIOVAN-HCT) 320-12.5 MG tablet Take 1 tablet by mouth daily.     Review of Systems:   Please see the history of present illness.    All other systems reviewed and are negative.     EKGs/Labs/Other Test Reviewed:   EKG: 2021 normal sinus rhythm; QTc 447 ms  EKG Interpretation Date/Time:  Wednesday Jan 23 2024 13:49:36 EDT Ventricular Rate:  81 PR Interval:  164 QRS Duration:  86 QT Interval:  366 QTC Calculation: 425 R Axis:   21  Text Interpretation: Normal sinus rhythm Minimal voltage criteria for LVH, may be normal variant ( R in aVL ) When compared with ECG of 17-Apr-2008 12:48, No significant change was found Confirmed by Alyssa Backbone ([id number]) on 01/23/2024 1:59:26 PM         Risk Assessment/Calculations:          Physical Exam:   VS:  BP 126/84   Pulse 81   Ht 5\' 5"  (1.651 m)   Wt 281 lb (127.5 kg)   SpO2 97%   BMI 46.76 kg/m        Wt Readings from Last 3 Encounters:  01/23/24 281 lb (127.5 kg)  03/30/20 256 lb (116.1 kg)  03/16/20 258 lb (117 kg)      GENERAL:  No  apparent distress, AOx3 HEENT:  No carotid bruits, +2 carotid impulses, no scleral icterus CAR: RRR no murmurs, gallops, rubs, or thrills RES:  Clear to auscultation bilaterally ABD:  Soft, nontender, nondistended, positive bowel sounds x 4 VASC:  +2 radial pulses, +2 carotid pulses NEURO:  CN 2-12 grossly intact; motor and sensory grossly intact PSYCH:  No active depression or anxiety EXT:  No edema, ecchymosis, or cyanosis  Signed, Rebecca Aubuchon K Edwen Mclester, MD  01/23/2024 2:49 PM    [facility] [address] [place], [place], [place]  [address] Phone: [phone]; Fax: [phone]   Note:  This document was prepared using Dragon voice recognition software and may include unintentional dictation errors.

## 2024-01-23 ENCOUNTER — Encounter: Payer: Self-pay | Admitting: Internal Medicine

## 2024-01-23 ENCOUNTER — Ambulatory Visit: Admitting: Cardiology

## 2024-01-23 ENCOUNTER — Ambulatory Visit: Attending: Internal Medicine | Admitting: Internal Medicine

## 2024-01-23 ENCOUNTER — Ambulatory Visit: Attending: Internal Medicine

## 2024-01-23 VITALS — BP 126/84 | HR 81 | Ht 65.0 in | Wt 281.0 lb

## 2024-01-23 DIAGNOSIS — R002 Palpitations: Secondary | ICD-10-CM

## 2024-01-23 DIAGNOSIS — I10 Essential (primary) hypertension: Secondary | ICD-10-CM

## 2024-01-23 DIAGNOSIS — Z6841 Body Mass Index (BMI) 40.0 and over, adult: Secondary | ICD-10-CM | POA: Diagnosis not present

## 2024-01-23 DIAGNOSIS — R072 Precordial pain: Secondary | ICD-10-CM | POA: Diagnosis not present

## 2024-01-23 DIAGNOSIS — F952 Tourette's disorder: Secondary | ICD-10-CM

## 2024-01-23 MED ORDER — METOPROLOL TARTRATE 100 MG PO TABS: ORAL_TABLET | ORAL | 0 refills | Status: DC

## 2024-01-23 NOTE — Progress Notes (Unsigned)
 Enrolled for [employer] to mail a ZIO XT long term holter monitor to the patients address on file.

## 2024-01-23 NOTE — Patient Instructions (Addendum)
 Medication Instructions:  No changes *If you need a refill on your cardiac medications before your next appointment, please call your pharmacy*  Lab Work: none If you have labs (blood work) drawn today and your tests are completely normal, you will receive your results only by: MyChart Message (if you have MyChart) OR A paper copy in the mail If you have any lab test that is abnormal or we need to change your treatment, we will call you to review the results.  Testing/Procedures: Your physician has requested that you have an echocardiogram. Echocardiography is a painless test that uses sound waves to create images of your heart. It provides your doctor with information about the size and shape of your heart and how well your heart's chambers and valves are working. This procedure takes approximately one hour. There are no restrictions for this procedure. Please do NOT wear cologne, perfume, aftershave, or lotions (deodorant is allowed). Please arrive 15 minutes prior to your appointment time.  Please note: We ask at that you not bring children with you during ultrasound (echo/ vascular) testing. Due to room size and safety concerns, children are not allowed in the ultrasound rooms during exams. Our front office staff cannot provide observation of children in our lobby area while testing is being conducted. An adult accompanying a patient to their appointment will only be allowed in the ultrasound room at the discretion of the ultrasound technician under special circumstances. We apologize for any inconvenience.  Zio XT Heart Monitor - 7 days - see instructions below  Coronary CT Angiogram - see instructions below  Follow-Up: As needed    Your cardiac CT will be scheduled at one of the below locations:   [facility] [address] [place], [place] [address] [phone]  Jeralene Mom. [facility] and Vascular Tower [address]  [place], [place] [address] Opening December 24, 2023  If scheduled at [facility], please arrive at the [facility] and Children's Entrance (Entrance C2) of [facility] 30 minutes prior to test start time. You can use the FREE valet parking offered at entrance C (encouraged to control the heart rate for the test)  Proceed to the [facility] Radiology Department (first floor) to check-in and test prep.   All radiology patients and guests should use entrance C2 at [facility], accessed from [facility], even though the hospital's physical address listed is [address].    If scheduled at the Heart and Vascular Tower at [address], please enter the parking lot using the [address] entrance and use the FREE valet service at the patient drop-off area. Enter the buidling and check-in with registration on the main floor.  Please follow these instructions carefully (unless otherwise directed):  An IV will be required for this test and Nitroglycerin will be given.   On the Night Before the Test: Be sure to Drink plenty of water. Do not consume any caffeinated/decaffeinated beverages or chocolate 12 hours prior to your test. Do not take any antihistamines 12 hours prior to your test.  On the Day of the Test: Drink plenty of water until 1 hour prior to the test. Do not eat any food 1 hour prior to test. You may take your regular medications prior to the test.  Take metoprolol (Lopressor) two hours prior to test.  Do not take Toprol XL that morning. If you take valsartan-hydrochlorothiazide Patients who wear a continuous glucose monitor MUST remove the device prior  to scanning. FEMALES- please wear underwire-free bra if available, avoid dresses & tight clothing      After the Test: Drink plenty of water. After receiving IV contrast, you may experience a mild flushed feeling. This is normal. On occasion, you may experience a mild rash up to 24 hours after the test. This is not dangerous.  If this occurs, you can take Benadryl 25 mg, Zyrtec, Claritin, or Allegra and increase your fluid intake. (Patients taking Tikosyn should avoid Benadryl, and may take Zyrtec, Claritin, or Allegra) If you experience trouble breathing, this can be serious. If it is severe call 911 IMMEDIATELY. If it is mild, please call our office.  We will call to schedule your test 2-4 weeks out understanding that some insurance companies will need an authorization prior to the service being performed.   For more information and frequently asked questions, please visit our website : [email or web address]  For non-scheduling related questions, please contact the cardiac imaging nurse navigator should you have any questions/concerns: Cardiac Imaging Nurse Navigators Direct Office Dial: [phone]   For scheduling needs, including cancellations and rescheduling, please call [place], [phone].  ZIO XT- Long Term Monitor Instructions  Your physician has requested you wear a ZIO patch monitor for 7 days.  This is a single patch monitor. [employer] supplies one patch monitor per enrollment. Additional stickers are not available. Please do not apply patch if you will be having a Nuclear Stress Test,  Echocardiogram, Cardiac CT, MRI, or Chest Xray during the period you would be wearing the  monitor. The patch cannot be worn during these tests. You cannot remove and re-apply the  ZIO XT patch monitor.  Your ZIO patch monitor will be mailed 3 day USPS to your address on file. It may take 3-5 days  to receive your monitor after you have been enrolled.  Once you have received your monitor, please review the enclosed instructions. Your monitor  has already been registered assigning a specific monitor serial # to you.  Billing and Patient Assistance Program Information  We have supplied [employer] with any of your insurance information on file for billing purposes. [employer] offers a sliding scale Patient  Assistance Program for patients that do not have  insurance, or whose insurance does not completely cover the cost of the ZIO monitor.  You must apply for the Patient Assistance Program to qualify for this discounted rate.  To apply, please call [employer] at [phone], select option 4, select option 2, ask to apply for  Patient Assistance Program. Sanna Crystal will ask your household income, and how many people  are in your household. They will quote your out-of-pocket cost based on that information.  [employer] will also be able to set up a 68-month, interest-free payment plan if needed.  Applying the monitor  Hold abrader disc by orange tab. Rub abrader in 40 strokes over the upper left chest as  indicated in your monitor instructions.  Clean area with 4 enclosed alcohol pads. Let dry.  Apply patch as indicated in monitor instructions. Patch will be placed under collarbone on left  side of chest with arrow pointing upward.  Rub patch adhesive wings for 2 minutes. Remove white label marked "1". Remove the white  label marked "2". Rub patch adhesive wings for 2 additional minutes.  While looking in a mirror, press and release button in center of patch. A small green light will  flash 3-4 times. This will be your only indicator that the monitor has been turned on.  Do not shower for the first 24 hours. You may shower after the first 24 hours.  Press the button if you feel a symptom. You will hear a small click. Record Date, Time and  Symptom in the Patient Logbook.  When you are ready to remove the patch, follow instructions on the last 2 pages of Patient  Logbook. Stick patch monitor onto the last page of Patient Logbook.  Place Patient Logbook in the blue and white box. Use locking tab on box and tape box closed  securely. The blue and white box has prepaid postage on it. Please place it in the mailbox as  soon as possible. Your physician should have your test results approximately 7 days after  the  monitor has been mailed back to [facility].  Call [facility] Customer Care at [phone] if you have questions regarding  your ZIO XT patch monitor. Call them immediately if you see an orange light blinking on your  monitor.  If your monitor falls off in less than 4 days, contact our Monitor department at [phone].  If your monitor becomes loose or falls off after 4 days call [employer] at [phone] for  suggestions on securing your monitor

## 2024-02-07 ENCOUNTER — Encounter (HOSPITAL_COMMUNITY): Payer: Self-pay

## 2024-02-11 ENCOUNTER — Encounter: Payer: Self-pay | Admitting: Internal Medicine

## 2024-02-11 ENCOUNTER — Ambulatory Visit (HOSPITAL_COMMUNITY)
Admission: RE | Admit: 2024-02-11 | Discharge: 2024-02-11 | Disposition: A | Discharge status: Home / Self Care | Source: Ambulatory Visit | Attending: Internal Medicine | Admitting: Internal Medicine

## 2024-02-11 DIAGNOSIS — R072 Precordial pain: Secondary | ICD-10-CM | POA: Diagnosis present

## 2024-02-11 DIAGNOSIS — I2510 Atherosclerotic heart disease of native coronary artery without angina pectoris: Secondary | ICD-10-CM | POA: Diagnosis not present

## 2024-02-11 DIAGNOSIS — E785 Hyperlipidemia, unspecified: Secondary | ICD-10-CM

## 2024-02-11 DIAGNOSIS — Z5181 Encounter for therapeutic drug level monitoring: Secondary | ICD-10-CM

## 2024-02-11 MED ORDER — NITROGLYCERIN 0.4 MG SL SUBL
SUBLINGUAL_TABLET | SUBLINGUAL | Status: AC
  Filled 2024-02-11: qty 2

## 2024-02-11 MED ORDER — IOHEXOL 350 MG/ML SOLN
100.0000 mL | Freq: Once | INTRAVENOUS | Status: AC | PRN
  Administered 2024-02-11: 100 mL via INTRAVENOUS

## 2024-02-11 MED ORDER — DILTIAZEM HCL 25 MG/5ML IV SOLN
10.0000 mg | INTRAVENOUS | Status: DC | PRN
Start: 2024-02-11 — End: 2024-02-12

## 2024-02-11 MED ORDER — METOPROLOL TARTRATE 5 MG/5ML IV SOLN: 10.0000 mg | Freq: Once | INTRAVENOUS | Status: DC | PRN

## 2024-02-11 MED ORDER — NITROGLYCERIN 0.4 MG SL SUBL
0.8000 mg | SUBLINGUAL_TABLET | Freq: Once | SUBLINGUAL | Status: AC
  Administered 2024-02-11: 0.8 mg via SUBLINGUAL

## 2024-02-12 ENCOUNTER — Ambulatory Visit: Payer: Self-pay | Admitting: Internal Medicine

## 2024-02-12 DIAGNOSIS — R002 Palpitations: Secondary | ICD-10-CM

## 2024-02-12 MED ORDER — ATORVASTATIN CALCIUM 20 MG PO TABS: 20.0000 mg | ORAL_TABLET | Freq: Every day | ORAL | 1 refills | Status: DC

## 2024-02-12 MED ORDER — ASPIRIN 81 MG PO TBEC: 81.0000 mg | DELAYED_RELEASE_TABLET | Freq: Every day | ORAL | Status: AC

## 2024-02-12 NOTE — Telephone Encounter (Signed)
 Arun K Thukkani, MD 02/12/2024 11:23 AM EDT     Start ASA 81, atorvastatin 20, lipids/lfts in 2 mo   Advised patient, verbalized understanding.  Mailed lab orders  Patient stated she is having shortness of breath with exertion, vacuuming, bending over cleaning tub, and drying off after showering Denies any swelling  Has Echo scheduled in July.  Per patient she had full workup with labs including CBC about a month ago  She will work on losing weight   Advised to keep Echo as scheduled, will forward to Dr Lorie Rook for review and will reach out if any further recommendations

## 2024-02-12 NOTE — Addendum Note (Signed)
 Addended by: Marci Setter B on: 02/12/2024 04:00 PM   Modules accepted: Orders

## 2024-02-12 NOTE — Telephone Encounter (Signed)
 Left voice message to call back 6/17

## 2024-02-27 ENCOUNTER — Encounter: Payer: Self-pay | Admitting: Internal Medicine

## 2024-02-27 NOTE — Telephone Encounter (Signed)
Please review and advise patient.     Thank you.

## 2024-03-03 ENCOUNTER — Ambulatory Visit (HOSPITAL_COMMUNITY)
Admission: RE | Admit: 2024-03-03 | Discharge: 2024-03-03 | Disposition: A | Discharge status: Home / Self Care | Source: Ambulatory Visit | Attending: Cardiology | Admitting: Cardiology

## 2024-03-03 DIAGNOSIS — R072 Precordial pain: Secondary | ICD-10-CM | POA: Diagnosis present

## 2024-03-03 LAB — ECHOCARDIOGRAM COMPLETE
Area-P 1/2: 4.39 cm2
S' Lateral: 2.2 cm

## 2024-04-09 ENCOUNTER — Other Ambulatory Visit: Payer: Self-pay | Admitting: Obstetrics and Gynecology

## 2024-04-09 DIAGNOSIS — Z09 Encounter for follow-up examination after completed treatment for conditions other than malignant neoplasm: Secondary | ICD-10-CM

## 2024-04-09 DIAGNOSIS — N6459 Other signs and symptoms in breast: Secondary | ICD-10-CM

## 2024-05-10 ENCOUNTER — Ambulatory Visit: Payer: Self-pay | Admitting: Internal Medicine

## 2024-05-10 LAB — HEPATIC FUNCTION PANEL
ALT: 18 IU/L (ref 0–32)
AST: 13 IU/L (ref 0–40)
Albumin: 4 g/dL (ref 3.9–4.9)
Alkaline Phosphatase: 92 IU/L (ref 44–121)
Bilirubin Total: 0.3 mg/dL (ref 0.0–1.2)
Bilirubin, Direct: 0.1 mg/dL (ref 0.00–0.40)
Total Protein: 6.7 g/dL (ref 6.0–8.5)

## 2024-05-10 LAB — LIPID PANEL
Chol/HDL Ratio: 3.7 ratio (ref 0.0–4.4)
Cholesterol, Total: 126 mg/dL (ref 100–199)
HDL: 34 mg/dL — ABNORMAL LOW (ref >39)
LDL Chol Calc (NIH): 70 mg/dL (ref 0–99)
Triglycerides: 123 mg/dL (ref 0–149)
VLDL Cholesterol Cal: 22 mg/dL (ref 5–40)

## 2024-06-09 ENCOUNTER — Ambulatory Visit
Admission: RE | Admit: 2024-06-09 | Discharge: 2024-06-09 | Disposition: A | Source: Ambulatory Visit | Attending: Obstetrics and Gynecology | Admitting: Obstetrics and Gynecology

## 2024-06-09 ENCOUNTER — Ambulatory Visit
Admission: RE | Admit: 2024-06-09 | Discharge: 2024-06-09 | Disposition: A | Discharge status: Home / Self Care | Source: Ambulatory Visit | Attending: Obstetrics and Gynecology | Admitting: Obstetrics and Gynecology

## 2024-06-09 DIAGNOSIS — N6459 Other signs and symptoms in breast: Secondary | ICD-10-CM

## 2024-06-09 DIAGNOSIS — Z09 Encounter for follow-up examination after completed treatment for conditions other than malignant neoplasm: Secondary | ICD-10-CM

## 2024-07-12 NOTE — Progress Notes (Signed)
 Cardiology Office Note:   Date:  07/18/2024  ID:  Rebecca [name], Rebecca [name], 1987, MRN [id number] PCP:  Rebecca Channel, MD  [facility] Providers Cardiologist:  Wendel Haws, MD Referring MD: Rebecca Channel, MD  Chief Complaint/Reason for Referral:  Chest pain ASSESSMENT:    1. Mild CAD   2. Hyperlipidemia LDL goal <70   3. Primary hypertension   4. Palpitations   5. Tourette's disorder   6. BMI 45.0-49.9, adult (HCC)      PLAN:   In order of problems listed above: Mild CAD: Reassuring evaluation and no recurrent chest pain.  Restart ASA 81mg , atorvastatin  20mg .  Discussed with patient.  Will keep follow-up open-ended.  Patient will have cholesterol checked by PCP. Hyperlipidemia:  LDL recently was 70.  Check LpA; if either elevated will target LDL < 55. Hypertension: Continue valsartan/hydrochlorothiazide 320 x 12.5 mg daily and Toprol  100 mg daily.  Repeat blood pressure on my measure 110/75. Palpitations:  Reassuring  monitor.  Continue Toprol  100mg  daily. Tourette's disorder: On pimozide .  EKG today shows Qtc 466 Elevated BMI: Intolerant of GLPRAs.            Dispo:  Return if symptoms worsen or fail to improve.      Medication Adjustments/Labs and Tests Ordered: Current medicines are reviewed at length with the patient today.  Concerns regarding medicines are outlined above.  The following changes have been made:  no change   Labs/tests ordered: Orders Placed This Encounter  Procedures   Lipoprotein A (LPA)   EKG 12-Lead    Medication Changes: No orders of the defined types were placed in this encounter.   Current medicines are reviewed at length with the patient today.  The patient does not have concerns regarding medicines.    History of Present Illness:    FOCUSED PROBLEM LIST:   CAD Minimal, CAC 0 coronary CTA 2025 No DD, no valve issues, EF 60-65% TTE July 2025 Hyperlipidemia Hypertension Hypothyroidism Palpitations Rare PACs, PVCs  Monitor June 2025 Tourette's syndrome On pimozide  BMI 47 Intolerant of GLPRAs.   May 2025:  Patient consents to use of AI scribe. The patient is a 38 year old female with the above listed medical problems referred for conditions regarding chest pain and palpitations.  The patient was seen by her primary care provider recently.  She is on Orap  (pimozide ) chronically for Tourette's syndrome.  An EKG was performed which showed a QTc of around 468 ms similar to a previous EKG with a QTc of around 465 ms.  Additionally she noted an episode of chest tightness with shoulder pain and dyspnea.  She has experienced chest tightness and palpitations for the past couple of years. The chest tightness is described as very tight, occurring in her shoulders and radiating around her chest. It is not relieved by deep breathing and can occur at any time, regardless of activity or rest. The episodes are infrequent, occurring once every two to three months, and last for about one to two minutes. The chest tightness does not seem to be triggered by specific activities and does not significantly impact her lifestyle.  She also experiences palpitations, described as her heart 'jumping or racing around', which she sometimes feels in her neck. These occur several times a month, possibly once a week, and are more frequent than the chest tightness. No lightheadedness or syncope.  Her past medical history includes hypertension, for which she takes two blood pressure medications, and she is on medication for Tourette's syndrome. She  also takes thyroid  medication. She has a family history of heart disease, which raises her concern about her symptoms.  Plan:  Refer to CTA, TTE, monitor.  November 2025:  Patient consents to use of AI scribe. The patient's coronary CTA demonstrated mild nonobstructive disease.  Her echo and monitor were reassuring.  However, she has not had any recent episodes of chest pain. A previous CT scan  revealed minimal blockages in her heart arteries.  She is currently experiencing a severe cough and a urinary tract infection (UTI).  She has been taking Zepbound for weight loss, which has caused severe nausea and vomiting. Previously, she tried Wegovy, which also resulted in similar side effects. Despite these adverse effects, she has lost 12 to 13 pounds and notes a significant reduction in appetite.  She occasionally checks her blood pressure at home, which typically ranges from the 120s to 130s over 80s.     Current Medications: Current Meds  Medication Sig   aspirin  EC 81 MG tablet Take 1 tablet (81 mg total) by mouth daily. Swallow whole.   atorvastatin  (LIPITOR) 20 MG tablet Take 1 tablet (20 mg total) by mouth daily.   clonazePAM  (KLONOPIN ) 1 MG tablet Take 1 tablet (1 mg total) by mouth at bedtime as needed for anxiety.   cyanocobalamin  (VITAMIN B12) 1000 MCG/ML injection INJECT 1 ML IN THE MUSCLE ONCE A WEEK FOR 4 WEEKS THEN INJECT 1 ML MONTHLY   dicyclomine  (BENTYL ) 20 MG tablet Take 1 tablet (20 mg total) by mouth 2 (two) times daily as needed for up to 3 days for spasms.   ketoconazole (NIZORAL) 2 % cream APPLY TOPICALLY TWICE DAILY AS NEEDED   levothyroxine (SYNTHROID, LEVOTHROID) 75 MCG tablet Take 75 mcg by mouth daily before breakfast.    metoprolol  succinate (TOPROL -XL) 100 MG 24 hr tablet Take 100 mg by mouth daily. Take with or immediately following a meal.   omeprazole (PRILOSEC) 20 MG capsule omeprazole 20 mg cpdr   pimozide  (ORAP ) 2 MG tablet Take 2 tablets (4 mg total) by mouth daily.   valsartan-hydrochlorothiazide (DIOVAN-HCT) 320-12.5 MG tablet Take 1 tablet by mouth daily.   ZEPBOUND 10 MG/0.5ML Pen SMARTSIG:1 pre-filled pen syringe SUB-Q Once a Week     Review of Systems:   Please see the history of present illness.    All other systems reviewed and are negative.     EKGs/Labs/Other Test Reviewed:   EKG: 2025 NSR QTC 425  EKG  Interpretation Date/Time:  Friday July 18 2024 08:06:19 EST Ventricular Rate:  108 PR Interval:  166 QRS Duration:  94 QT Interval:  348 QTC Calculation: 466 R Axis:   40  Text Interpretation: Sinus tachycardia When compared with ECG of 23-Jan-2024 13:49, No significant change was found Confirmed by Wendel Haws ([id number]) on 07/18/2024 8:13:18 AM         Risk Assessment/Calculations:          Physical Exam:   VS:  BP 124/88   Pulse (!) 108   Ht 5' 5 (1.651 m)   Wt 265 lb 9.6 oz (120.5 kg)   SpO2 97%   BMI 44.20 kg/m        Wt Readings from Last 3 Encounters:  07/18/24 265 lb 9.6 oz (120.5 kg)  01/23/24 281 lb (127.5 kg)  03/30/20 256 lb (116.1 kg)      GENERAL:  No apparent distress, AOx3 HEENT:  No carotid bruits, +2 carotid impulses, no scleral icterus CAR: RRR no murmurs, gallops,  rubs, or thrills RES:  Clear to auscultation bilaterally ABD:  Soft, nontender, nondistended, positive bowel sounds x 4 VASC:  +2 radial pulses, +2 carotid pulses NEURO:  CN 2-12 grossly intact; motor and sensory grossly intact PSYCH:  No active depression or anxiety EXT:  No edema, ecchymosis, or cyanosis  Signed, Lurena MARLA Red, MD  07/18/2024 8:32 AM    [facility] [address] [place], [place], [place]  [address] Phone: [phone]; Fax: [phone]   Note:  This document was prepared using Dragon voice recognition software and may include unintentional dictation errors.

## 2024-07-18 ENCOUNTER — Encounter: Payer: Self-pay | Admitting: Internal Medicine

## 2024-07-18 ENCOUNTER — Ambulatory Visit: Attending: Internal Medicine | Admitting: Internal Medicine

## 2024-07-18 VITALS — BP 124/88 | HR 108 | Ht 65.0 in | Wt 265.6 lb

## 2024-07-18 DIAGNOSIS — E785 Hyperlipidemia, unspecified: Secondary | ICD-10-CM

## 2024-07-18 DIAGNOSIS — R002 Palpitations: Secondary | ICD-10-CM

## 2024-07-18 DIAGNOSIS — Z6841 Body Mass Index (BMI) 40.0 and over, adult: Secondary | ICD-10-CM

## 2024-07-18 DIAGNOSIS — I2510 Atherosclerotic heart disease of native coronary artery without angina pectoris: Secondary | ICD-10-CM | POA: Diagnosis not present

## 2024-07-18 DIAGNOSIS — I10 Essential (primary) hypertension: Secondary | ICD-10-CM | POA: Diagnosis not present

## 2024-07-18 DIAGNOSIS — F952 Tourette's disorder: Secondary | ICD-10-CM

## 2024-07-18 NOTE — Patient Instructions (Signed)
 Medication Instructions:  Your physician recommends that you continue on your current medications as directed. Please refer to the Current Medication list given to you today.  *If you need a refill on your cardiac medications before your next appointment, please call your pharmacy*  Lab Work: Today- LPA If you have labs (blood work) drawn today and your tests are completely normal, you will receive your results only by: MyChart Message (if you have MyChart) OR A paper copy in the mail If you have any lab test that is abnormal or we need to change your treatment, we will call you to review the results.   Follow-Up: As needed

## 2024-07-21 ENCOUNTER — Ambulatory Visit: Payer: Self-pay | Admitting: Internal Medicine

## 2024-07-21 LAB — LIPOPROTEIN A (LPA): Lipoprotein (a): 24.8 nmol/L (ref <75.0)

## 2024-07-31 ENCOUNTER — Other Ambulatory Visit: Payer: Self-pay | Admitting: Internal Medicine

## 2024-08-27 ENCOUNTER — Other Ambulatory Visit: Payer: Self-pay

## 2024-08-27 ENCOUNTER — Ambulatory Visit
Admission: RE | Admit: 2024-08-27 | Discharge: 2024-08-27 | Disposition: A | Discharge status: Home / Self Care | Source: Ambulatory Visit

## 2024-08-27 DIAGNOSIS — R059 Cough, unspecified: Secondary | ICD-10-CM

## 2024-09-16 ENCOUNTER — Other Ambulatory Visit: Payer: Self-pay | Admitting: Family Medicine

## 2024-09-16 ENCOUNTER — Ambulatory Visit
Admission: RE | Admit: 2024-09-16 | Discharge: 2024-09-16 | Disposition: A | Source: Ambulatory Visit | Attending: Family Medicine | Admitting: Family Medicine

## 2024-09-16 DIAGNOSIS — R0789 Other chest pain: Secondary | ICD-10-CM

## 2024-09-19 ENCOUNTER — Encounter: Payer: Self-pay | Admitting: Internal Medicine

## 2024-12-08 ENCOUNTER — Ambulatory Visit: Admitting: Psychiatry
